# Patient Record
Sex: Female | Born: 1980 | Race: Black or African American | Hispanic: No | Marital: Single | Smoking: Former smoker
Health system: Southern US, Community
[De-identification: ages and names within clinical notes are randomized; demographics above are authoritative.]

## PROBLEM LIST (undated history)

## (undated) DIAGNOSIS — F32A Depression, unspecified: Secondary | ICD-10-CM

## (undated) DIAGNOSIS — M1990 Unspecified osteoarthritis, unspecified site: Secondary | ICD-10-CM

## (undated) DIAGNOSIS — J302 Other seasonal allergic rhinitis: Secondary | ICD-10-CM

## (undated) DIAGNOSIS — D4860 Neoplasm of uncertain behavior of unspecified breast: Secondary | ICD-10-CM

## (undated) HISTORY — DX: Other seasonal allergic rhinitis: J30.2

## (undated) HISTORY — DX: Depression, unspecified: F32.A

## (undated) HISTORY — PX: GYNECOLOGIC CRYOSURGERY: SHX857

---
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
## 2002-02-24 ENCOUNTER — Other Ambulatory Visit: Admission: RE | Admit: 2002-02-24 | Discharge: 2002-02-24 | Payer: Self-pay | Admitting: Gynecology

## 2002-09-08 ENCOUNTER — Other Ambulatory Visit: Admission: RE | Admit: 2002-09-08 | Discharge: 2002-09-08 | Payer: Self-pay | Admitting: Obstetrics and Gynecology

## 2002-10-31 ENCOUNTER — Emergency Department (HOSPITAL_COMMUNITY): Admission: EM | Admit: 2002-10-31 | Discharge: 2002-10-31 | Payer: Self-pay | Admitting: *Deleted

## 2003-04-09 ENCOUNTER — Emergency Department (HOSPITAL_COMMUNITY): Admission: EM | Admit: 2003-04-09 | Discharge: 2003-04-09 | Payer: Self-pay | Admitting: Emergency Medicine

## 2003-05-08 ENCOUNTER — Other Ambulatory Visit: Admission: RE | Admit: 2003-05-08 | Discharge: 2003-05-08 | Payer: Self-pay | Admitting: Obstetrics and Gynecology

## 2003-08-09 ENCOUNTER — Emergency Department (HOSPITAL_COMMUNITY): Admission: EM | Admit: 2003-08-09 | Discharge: 2003-08-09 | Payer: Self-pay | Admitting: Family Medicine

## 2003-11-16 ENCOUNTER — Other Ambulatory Visit: Admission: RE | Admit: 2003-11-16 | Discharge: 2003-11-16 | Payer: Self-pay | Admitting: Obstetrics and Gynecology

## 2004-10-30 ENCOUNTER — Other Ambulatory Visit: Admission: RE | Admit: 2004-10-30 | Discharge: 2004-10-30 | Payer: Self-pay | Admitting: Obstetrics and Gynecology

## 2007-05-27 ENCOUNTER — Other Ambulatory Visit: Admission: RE | Admit: 2007-05-27 | Discharge: 2007-05-27 | Payer: Self-pay | Admitting: Obstetrics and Gynecology

## 2008-04-13 ENCOUNTER — Ambulatory Visit: Payer: Self-pay | Admitting: Women's Health

## 2008-04-13 ENCOUNTER — Other Ambulatory Visit: Admission: RE | Admit: 2008-04-13 | Discharge: 2008-04-13 | Payer: Self-pay | Admitting: Obstetrics and Gynecology

## 2008-04-13 ENCOUNTER — Encounter: Payer: Self-pay | Admitting: Women's Health

## 2009-08-12 ENCOUNTER — Ambulatory Visit: Payer: Self-pay | Admitting: Women's Health

## 2009-09-12 ENCOUNTER — Other Ambulatory Visit: Admission: RE | Admit: 2009-09-12 | Discharge: 2009-09-12 | Payer: Self-pay | Admitting: Obstetrics and Gynecology

## 2009-09-12 ENCOUNTER — Ambulatory Visit: Payer: Self-pay | Admitting: Women's Health

## 2010-01-26 NOTE — L&D Delivery Note (Signed)
Delivery Note At 12:34 AM a viable and healthy female was delivered via Vaginal, Spontaneous Delivery (Presentation: Right Occiput Anterior).  APGAR: 8, 9; weight 6 lb 13 oz (3090 g).   Placenta status: Intact, Spontaneous Pathology.  2nd to mat temp Cord: 3 vessels with the following complications: Short.  Cord pH: none  Anesthesia: Epidural  Episiotomy: None Lacerations: 2nd degree;Perineal Suture Repair: 3.0 chromic Est. Blood Loss (mL): 250  Mom to postpartum.  Baby to nursery-stable.  Gabrielle Caldwell A 11/25/2010, 1:22 AM

## 2010-03-28 ENCOUNTER — Ambulatory Visit (INDEPENDENT_AMBULATORY_CARE_PROVIDER_SITE_OTHER): Payer: Managed Care, Other (non HMO) | Admitting: Women's Health

## 2010-03-28 DIAGNOSIS — Z113 Encounter for screening for infections with a predominantly sexual mode of transmission: Secondary | ICD-10-CM

## 2010-03-28 DIAGNOSIS — N912 Amenorrhea, unspecified: Secondary | ICD-10-CM

## 2010-04-14 ENCOUNTER — Other Ambulatory Visit: Payer: Managed Care, Other (non HMO)

## 2010-04-14 ENCOUNTER — Ambulatory Visit (INDEPENDENT_AMBULATORY_CARE_PROVIDER_SITE_OTHER): Payer: Managed Care, Other (non HMO) | Admitting: Women's Health

## 2010-04-14 DIAGNOSIS — O9989 Other specified diseases and conditions complicating pregnancy, childbirth and the puerperium: Secondary | ICD-10-CM

## 2010-04-14 DIAGNOSIS — N912 Amenorrhea, unspecified: Secondary | ICD-10-CM

## 2010-07-31 ENCOUNTER — Encounter: Payer: Self-pay | Admitting: *Deleted

## 2010-10-28 LAB — STREP B DNA PROBE: GBS: NEGATIVE

## 2010-11-24 ENCOUNTER — Inpatient Hospital Stay (HOSPITAL_COMMUNITY): Payer: Managed Care, Other (non HMO) | Admitting: Anesthesiology

## 2010-11-24 ENCOUNTER — Encounter (HOSPITAL_COMMUNITY): Payer: Self-pay

## 2010-11-24 ENCOUNTER — Encounter (HOSPITAL_COMMUNITY): Payer: Self-pay | Admitting: *Deleted

## 2010-11-24 ENCOUNTER — Inpatient Hospital Stay (HOSPITAL_COMMUNITY)
Admission: AD | Admit: 2010-11-24 | Discharge: 2010-11-27 | DRG: 774 | Disposition: A | Discharge status: Home / Self Care | Payer: Managed Care, Other (non HMO) | Source: Ambulatory Visit | Attending: Obstetrics and Gynecology | Admitting: Obstetrics and Gynecology

## 2010-11-24 ENCOUNTER — Inpatient Hospital Stay (HOSPITAL_COMMUNITY)
Admission: AD | Admit: 2010-11-24 | Discharge: 2010-11-24 | Disposition: A | Discharge status: Home / Self Care | Payer: Managed Care, Other (non HMO) | Source: Ambulatory Visit | Attending: Obstetrics and Gynecology | Admitting: Obstetrics and Gynecology

## 2010-11-24 ENCOUNTER — Encounter (HOSPITAL_COMMUNITY): Payer: Self-pay | Admitting: Anesthesiology

## 2010-11-24 DIAGNOSIS — D62 Acute posthemorrhagic anemia: Secondary | ICD-10-CM | POA: Diagnosis not present

## 2010-11-24 DIAGNOSIS — O479 False labor, unspecified: Secondary | ICD-10-CM | POA: Insufficient documentation

## 2010-11-24 DIAGNOSIS — K649 Unspecified hemorrhoids: Secondary | ICD-10-CM | POA: Diagnosis present

## 2010-11-24 DIAGNOSIS — O9903 Anemia complicating the puerperium: Secondary | ICD-10-CM | POA: Diagnosis not present

## 2010-11-24 DIAGNOSIS — O878 Other venous complications in the puerperium: Secondary | ICD-10-CM | POA: Diagnosis present

## 2010-11-24 LAB — RPR: RPR Ser Ql: NONREACTIVE

## 2010-11-24 LAB — CBC
Hemoglobin: 11 g/dL — ABNORMAL LOW (ref 12.0–15.0)
MCH: 28.8 pg (ref 26.0–34.0)
Platelets: 297 10*3/uL (ref 150–400)
RBC: 3.82 MIL/uL — ABNORMAL LOW (ref 3.87–5.11)
WBC: 16.3 10*3/uL — ABNORMAL HIGH (ref 4.0–10.5)

## 2010-11-24 MED ORDER — PHENYLEPHRINE 40 MCG/ML (10ML) SYRINGE FOR IV PUSH (FOR BLOOD PRESSURE SUPPORT)
80.0000 ug | PREFILLED_SYRINGE | INTRAVENOUS | Status: DC | PRN
  Filled 2010-11-24: qty 5

## 2010-11-24 MED ORDER — NALBUPHINE SYRINGE 5 MG/0.5 ML
INJECTION | INTRAMUSCULAR | Status: AC
  Administered 2010-11-24: 10 mg via INTRAVENOUS
  Filled 2010-11-24: qty 1

## 2010-11-24 MED ORDER — LACTATED RINGERS IV SOLN
500.0000 mL | Freq: Once | INTRAVENOUS | Status: AC
  Administered 2010-11-24: 500 mL via INTRAVENOUS

## 2010-11-24 MED ORDER — CITRIC ACID-SODIUM CITRATE 334-500 MG/5ML PO SOLN: 30.0000 mL | ORAL | Status: DC | PRN

## 2010-11-24 MED ORDER — IBUPROFEN 600 MG PO TABS: 600.0000 mg | ORAL_TABLET | Freq: Four times a day (QID) | ORAL | Status: DC | PRN

## 2010-11-24 MED ORDER — LIDOCAINE HCL (PF) 1 % IJ SOLN
30.0000 mL | INTRAMUSCULAR | Status: DC | PRN
  Administered 2010-11-25: 30 mL via SUBCUTANEOUS

## 2010-11-24 MED ORDER — DIPHENHYDRAMINE HCL 50 MG/ML IJ SOLN: 12.5000 mg | INTRAMUSCULAR | Status: DC | PRN

## 2010-11-24 MED ORDER — TERBUTALINE SULFATE 1 MG/ML IJ SOLN: 0.2500 mg | Freq: Once | INTRAMUSCULAR | Status: AC | PRN

## 2010-11-24 MED ORDER — FENTANYL 2.5 MCG/ML BUPIVACAINE 1/10 % EPIDURAL INFUSION (WH - ANES)
INTRAMUSCULAR | Status: DC | PRN
  Administered 2010-11-24: 14 mL/h via EPIDURAL

## 2010-11-24 MED ORDER — ACETAMINOPHEN 325 MG PO TABS: 650.0000 mg | ORAL_TABLET | ORAL | Status: DC | PRN

## 2010-11-24 MED ORDER — OXYTOCIN BOLUS FROM INFUSION
500.0000 mL | Freq: Once | INTRAVENOUS | Status: DC
  Filled 2010-11-24: qty 500

## 2010-11-24 MED ORDER — FENTANYL 2.5 MCG/ML BUPIVACAINE 1/10 % EPIDURAL INFUSION (WH - ANES)
14.0000 mL/h | INTRAMUSCULAR | Status: DC
  Administered 2010-11-24 (×2): 14 mL/h via EPIDURAL
  Filled 2010-11-24 (×3): qty 60

## 2010-11-24 MED ORDER — EPHEDRINE 5 MG/ML INJ: 10.0000 mg | INTRAVENOUS | Status: DC | PRN

## 2010-11-24 MED ORDER — NALBUPHINE HCL 10 MG/ML IJ SOLN
10.0000 mg | Freq: Once | INTRAMUSCULAR | Status: AC
  Administered 2010-11-24: 10 mg via INTRAVENOUS

## 2010-11-24 MED ORDER — LACTATED RINGERS IV SOLN: 500.0000 mL | INTRAVENOUS | Status: DC | PRN

## 2010-11-24 MED ORDER — EPHEDRINE 5 MG/ML INJ
10.0000 mg | INTRAVENOUS | Status: DC | PRN
  Filled 2010-11-24: qty 4

## 2010-11-24 MED ORDER — LIDOCAINE HCL 1.5 % IJ SOLN
INTRAMUSCULAR | Status: DC | PRN
  Administered 2010-11-24 (×2): 5 mL via EPIDURAL

## 2010-11-24 MED ORDER — FLEET ENEMA 7-19 GM/118ML RE ENEM: 1.0000 | ENEMA | RECTAL | Status: DC | PRN

## 2010-11-24 MED ORDER — OXYTOCIN 20 UNITS IN LACTATED RINGERS INFUSION - SIMPLE
1.0000 m[IU]/min | INTRAVENOUS | Status: DC
  Administered 2010-11-24: 2 m[IU]/min via INTRAVENOUS
  Administered 2010-11-25: 333 m[IU]/min via INTRAVENOUS
  Filled 2010-11-24: qty 1000

## 2010-11-24 MED ORDER — LACTATED RINGERS IV SOLN
INTRAVENOUS | Status: DC
  Administered 2010-11-24: 18:00:00 via INTRAVENOUS

## 2010-11-24 MED ORDER — OXYTOCIN 20 UNITS IN LACTATED RINGERS INFUSION - SIMPLE: 125.0000 mL/h | Freq: Once | INTRAVENOUS | Status: DC

## 2010-11-24 MED ORDER — PHENYLEPHRINE 40 MCG/ML (10ML) SYRINGE FOR IV PUSH (FOR BLOOD PRESSURE SUPPORT): 80.0000 ug | PREFILLED_SYRINGE | INTRAVENOUS | Status: DC | PRN

## 2010-11-24 MED ORDER — OXYCODONE-ACETAMINOPHEN 5-325 MG PO TABS: 2.0000 | ORAL_TABLET | ORAL | Status: DC | PRN

## 2010-11-24 MED ORDER — LIDOCAINE HCL (PF) 1 % IJ SOLN
INTRAMUSCULAR | Status: AC
  Administered 2010-11-25: 30 mL via SUBCUTANEOUS
  Filled 2010-11-24: qty 30

## 2010-11-24 MED ORDER — ONDANSETRON HCL 4 MG/2ML IJ SOLN: 4.0000 mg | Freq: Four times a day (QID) | INTRAMUSCULAR | Status: DC | PRN

## 2010-11-24 NOTE — H&P (Signed)
  OB ADMISSION/ HISTORY & PHYSICAL:  Admission Date: 11/24/2010  3:45 PM  Admit Diagnosis: 39 weeks / active labor  Gabrielle Caldwell is a 30 y.o. female presenting for onset of labor.  Prenatal History: G2P0010   EDC : 11/30/2010 Prenatal care at [facility] since 11 weeks with Dr Cherly Hensen as primary.  Prenatal course has been uncomplicated.  Prenatal Labs: ABO, Rh: A (04/18 0000) Positive Antibody: Negative (04/18 0000) Rubella: Immune (04/18 0000)  RPR:   NR HBsAg: Negative (04/18 0000)  HIV: Non-reactive (04/18 0000)  GBS:   Negative 1 hr Glucola : 116 ( NL)   Medical / Surgical History :  Past medical history:  Past Medical History  Diagnosis Date  . 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 Dysmenorrhea   . Gonorrhea 08/2009  . Chlamydia 08/2009 & 03/2010  . 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 Gynecologic cryosurgery     FOR DYSPLASIA     Family History:  Family History  Problem Relation Age of Onset  . Adopted: Yes     Social History:  reports that she has never smoked. She has never used smokeless tobacco. She reports that she does not drink alcohol or use illicit drugs.   Allergies: Bee and Strawberry    Current Medications at time of admission:  Prenatal vitamin daily  Review of Systems: Onset of ctx with SROM at 1445  Physical Exam: Cervical exam by nurse on arrival to BS: Dilation: 6.5 Effacement (%): 100 Station: 0 Exam by:: apannell,rn  General: uncomfortable - moaning and crying with ctx Heart:RR Lungs: labored breathing with ctx Abdomen: gravid / NT Extremities:no edema  FHR: 145 / moderate variability / no decels TOCO: 2-6 minutes / moderate- strong   Assessment: 39 weeks active labor  Plan:  Admit Nubain 10 mg IV now - awaiting epidural placement Dr Cherly Hensen to follow when available from OR  BAILEY,TANYA 11/24/2010,  5:58 PM

## 2010-11-24 NOTE — ED Notes (Signed)
Pt. Sent walking x1 hour

## 2010-11-24 NOTE — Progress Notes (Signed)
Pt states went home, u/c's now in her back, feeling rectal pressure, srom at 1445, pink/clear tinged fluid. Unable to sleep. Decreased fm after leaving MAU this am.

## 2010-11-24 NOTE — Anesthesia Preprocedure Evaluation (Signed)
Anesthesia Evaluation  Patient identified by MRN, date of birth, ID band Patient awake  General Assessment Comment  Reviewed: Allergy & Precautions, H&P , NPO status , Patient's Chart, lab work & pertinent test results  Airway Mallampati: III TM Distance: >3 FB Neck ROM: full    Dental No notable dental hx.    Pulmonary    Pulmonary exam normal       Cardiovascular     Neuro/Psych Negative Neurological ROS  Negative Psych ROS   GI/Hepatic negative GI ROS Neg liver ROS    Endo/Other  Morbid obesity  Renal/GU negative Renal ROS  Genitourinary negative   Musculoskeletal negative musculoskeletal ROS (+)   Abdominal (+) obese,   Peds negative pediatric ROS (+)  Hematology negative hematology ROS (+)   Anesthesia Other Findings   Reproductive/Obstetrics (+) Pregnancy                           Anesthesia Physical Anesthesia Plan  ASA: III  Anesthesia Plan: Epidural   Post-op Pain Management:    Induction:   Airway Management Planned:   Additional Equipment:   Intra-op Plan:   Post-operative Plan:   Informed Consent: I have reviewed the patients History and Physical, chart, labs and discussed the procedure including the risks, benefits and alternatives for the proposed anesthesia with the patient or authorized representative who has indicated his/her understanding and acceptance.     Plan Discussed with:   Anesthesia Plan Comments:         Anesthesia Quick Evaluation

## 2010-11-24 NOTE — Progress Notes (Signed)
  TC from nurse - requesting IV order for analgesia pending epidural. Anesthesia delayed with emergency case / Dr Cherly Hensen finishing case now.  Order for Nubain 10 mg Iv now.

## 2010-11-24 NOTE — Progress Notes (Signed)
Pt states u/c's q2-3 minutes, worse post walking, was ft/90%. More mucus plug coming out still. +FM post walking.

## 2010-11-24 NOTE — Anesthesia Procedure Notes (Signed)
Epidural Patient location during procedure: OB Start time: 11/24/2010 6:21 PM End time: 11/24/2010 6:28 PM Reason for block: procedure for pain  Staffing Anesthesiologist: Sandrea Hughs Performed by: anesthesiologist   Preanesthetic Checklist Completed: patient identified, site marked, surgical consent, pre-op evaluation, timeout performed, IV checked, risks and benefits discussed and monitors and equipment checked  Epidural Patient position: sitting Prep: site prepped and draped and DuraPrep Patient monitoring: continuous pulse ox and blood pressure Approach: midline Injection technique: LOR air  Needle:  Needle type: Tuohy  Needle gauge: 17 G Needle length: 9 cm Needle insertion depth: 8 cm Catheter type: closed end flexible Catheter size: 19 Gauge Catheter at skin depth: 14 cm Test dose: negative and 1.5% lidocaine  Assessment Sensory level: T10 Events: blood not aspirated, injection not painful, no injection resistance, negative IV test and no paresthesia

## 2010-11-24 NOTE — ED Notes (Signed)
Dr. Cherly Hensen notified pt post walking, no cervical change, okay to d/c pt to home.

## 2010-11-25 ENCOUNTER — Other Ambulatory Visit: Payer: Self-pay | Admitting: Obstetrics and Gynecology

## 2010-11-25 ENCOUNTER — Encounter (HOSPITAL_COMMUNITY): Payer: Self-pay

## 2010-11-25 MED ORDER — ONDANSETRON HCL 4 MG PO TABS: 4.0000 mg | ORAL_TABLET | ORAL | Status: DC | PRN

## 2010-11-25 MED ORDER — DIPHENHYDRAMINE HCL 25 MG PO CAPS: 25.0000 mg | ORAL_CAPSULE | Freq: Four times a day (QID) | ORAL | Status: DC | PRN

## 2010-11-25 MED ORDER — SENNOSIDES-DOCUSATE SODIUM 8.6-50 MG PO TABS
2.0000 | ORAL_TABLET | Freq: Every day | ORAL | Status: DC
  Administered 2010-11-25 – 2010-11-26 (×2): 2 via ORAL

## 2010-11-25 MED ORDER — IBUPROFEN 600 MG PO TABS
600.0000 mg | ORAL_TABLET | Freq: Four times a day (QID) | ORAL | Status: DC
  Administered 2010-11-25 – 2010-11-27 (×9): 600 mg via ORAL
  Filled 2010-11-25 (×9): qty 1

## 2010-11-25 MED ORDER — TETANUS-DIPHTH-ACELL PERTUSSIS 5-2.5-18.5 LF-MCG/0.5 IM SUSP
0.5000 mL | Freq: Once | INTRAMUSCULAR | Status: AC
  Administered 2010-11-26: 0.5 mL via INTRAMUSCULAR
  Filled 2010-11-25: qty 0.5

## 2010-11-25 MED ORDER — FERROUS SULFATE 325 (65 FE) MG PO TABS
325.0000 mg | ORAL_TABLET | Freq: Two times a day (BID) | ORAL | Status: DC
  Administered 2010-11-25 – 2010-11-27 (×4): 325 mg via ORAL
  Filled 2010-11-25 (×4): qty 1

## 2010-11-25 MED ORDER — WITCH HAZEL-GLYCERIN EX PADS: 1.0000 "application " | MEDICATED_PAD | CUTANEOUS | Status: DC | PRN

## 2010-11-25 MED ORDER — PRENATAL PLUS 27-1 MG PO TABS
1.0000 | ORAL_TABLET | Freq: Every day | ORAL | Status: DC
  Administered 2010-11-25 – 2010-11-27 (×3): 1 via ORAL
  Filled 2010-11-25 (×3): qty 1

## 2010-11-25 MED ORDER — OXYCODONE-ACETAMINOPHEN 5-325 MG PO TABS
1.0000 | ORAL_TABLET | ORAL | Status: DC | PRN
  Administered 2010-11-25 (×2): 1 via ORAL
  Administered 2010-11-26: 2 via ORAL
  Filled 2010-11-25 (×2): qty 1
  Filled 2010-11-25: qty 2

## 2010-11-25 MED ORDER — ZOLPIDEM TARTRATE 5 MG PO TABS: 5.0000 mg | ORAL_TABLET | Freq: Every evening | ORAL | Status: DC | PRN

## 2010-11-25 MED ORDER — BENZOCAINE-MENTHOL 20-0.5 % EX AERO: 1.0000 "application " | INHALATION_SPRAY | CUTANEOUS | Status: DC | PRN

## 2010-11-25 MED ORDER — DIBUCAINE 1 % RE OINT
1.0000 "application " | TOPICAL_OINTMENT | RECTAL | Status: DC | PRN
  Administered 2010-11-26: 1 via RECTAL
  Filled 2010-11-25: qty 28

## 2010-11-25 MED ORDER — SIMETHICONE 80 MG PO CHEW: 80.0000 mg | CHEWABLE_TABLET | ORAL | Status: DC | PRN

## 2010-11-25 MED ORDER — ONDANSETRON HCL 4 MG/2ML IJ SOLN: 4.0000 mg | INTRAMUSCULAR | Status: DC | PRN

## 2010-11-25 MED ORDER — LANOLIN HYDROUS EX OINT: TOPICAL_OINTMENT | CUTANEOUS | Status: DC | PRN

## 2010-11-25 NOTE — Addendum Note (Signed)
Addendum  created 11/25/10 0932 by Chrisha Vogel   Modules edited:Charges VN, Notes Section    

## 2010-11-25 NOTE — Progress Notes (Signed)
S: comfortable. Notes some rectal pressure  O: Ve : fully dilated (+) 2 station Pitocin 6 MIU  Tracing: baseline 140 (+) some early decel some variable cgtx q 2 mins  IMP: Complete P) start pushing

## 2010-11-25 NOTE — Anesthesia Postprocedure Evaluation (Signed)
Anesthesia Post Note  Patient: Gabrielle Caldwell  Procedure(s) Performed: * No procedures listed *  Anesthesia type: Epidural  Patient location: Mother/Baby  Post pain: Pain level controlled  Post assessment: Post-op Vital signs reviewed  Last Vitals:  Filed Vitals:   11/25/10 0800  BP: 130/82  Pulse: 69  Temp: 36.9 C  Resp: 18    Post vital signs: Reviewed  Level of consciousness: awake  Complications: No apparent anesthesia complications

## 2010-11-25 NOTE — Addendum Note (Signed)
Addendum  created 11/25/10 0932 by Cephus Shelling   Modules edited:Charges VN, Notes Section

## 2010-11-25 NOTE — Progress Notes (Signed)
  PPD 0 SVD  S:  Reports feeling exhausted - no sleep in past few days             Tolerating po/ No nausea or vomiting             Bleeding is moderate             Pain controlled withprescription NSAID's including motrin and narcotic analgesics including percocet             Up ad lib / ambulatory  Newborn breast feeding  / Circumcision requested for tomorrow   O:  A & O x 3              VS: Blood pressure 130/82, pulse 69, temperature 98.5 F (36.9 C), temperature source Oral, resp. rate 18, height 5' 2.5" (1.588 m), weight 103.874 kg (229 lb), SpO2 100.00%, unknown if currently breastfeeding.  LABS: Lab Results  Component Value Date   WBC 16.3* 11/24/2010   HGB 11.0* 11/24/2010   HCT 33.2* 11/24/2010   MCV 86.9 11/24/2010   PLT 297 11/24/2010     Lungs: Clear and unlabored  Heart: regular rate and rhythm / no mumurs  Abdomen: soft, non-tender, non-distended              Fundus: firm, non-tender, Ueven  Perineum: mild edema / ice pack in place  Lochia: moderate  Extremities: trace edema, no calf pain or tenderness    A: PPD # 0   Doing well - stable status  P:  Routine post partum orders  Newborn circ tomorrow             Encouraged rest today  Aravind Chrismer 11/25/2010, 9:31 AM

## 2010-11-25 NOTE — Anesthesia Postprocedure Evaluation (Signed)
  Anesthesia Post Note  Patient: Gabrielle Caldwell  Procedure(s) Performed: * No procedures listed *  Anesthesia type: Epidural  Patient location: Mother/Baby  Post pain: Pain level controlled  Post assessment: Post-op Vital signs reviewed  Last Vitals:  Filed Vitals:   11/25/10 0800  BP: 130/82  Pulse: 69  Temp: 36.9 C  Resp: 18    Post vital signs: Reviewed  Level of consciousness:alert  Complications: No apparent anesthesia complications

## 2010-11-25 NOTE — Progress Notes (Signed)
S: Epidural Comfortable   VE: 5/90/-2 ? Light mec fluid  IUPC placed Tracing: reassuring, ctx q 2-4 mins  Imp: Active phase Term gestation  P) Pitocin augmentation

## 2010-11-26 LAB — CBC
MCHC: 32.4 g/dL (ref 30.0–36.0)
Platelets: 235 10*3/uL (ref 150–400)
RDW: 14.5 % (ref 11.5–15.5)

## 2010-11-26 LAB — URINE CULTURE
Colony Count: NO GROWTH
Culture  Setup Time: 201210300452
Culture: NO GROWTH
Special Requests: NORMAL

## 2010-11-26 NOTE — Progress Notes (Signed)
  PPD # 1  Subjective: Pt reports feeling well/ Pain controlled with prescription NSAID's including motrin and narcotic analgesics including percocet Tolerating po/ Voiding without problems/ No n/v Bleeding is light Newborn info:  Information for the patient's newborn:  Annet, Manukyan [[id number]]  female  / circ today/ Feeding: breast    Objective:  VS: Blood pressure 117/77, pulse 75, temperature 98.7 F (37.1 C), temperature source Oral, resp. rate 18  Basename 11/26/10 0555 11/24/10 1725  WBC 11.8* 16.3*  HGB 8.4* 11.0*  HCT 25.9* 33.2*  PLT 235 297    Blood type: A/Positive/-- (04/18 0000) Rubella: Immune (04/18 0000)    Physical Exam:  General: alert, cooperative and no distress Abdomen: soft, nontender, normal bowel sounds Uterine Fundus: firm, below umbilicus, nontender Perineum: healing with good reapproximation Rectum: cluster of 2 to 3 sm hemorrhoids; non thrombosed, pink Lochia: minimal Ext: Homans sign is negative, no sign of DVT   A/P: PPD # 1/ G2P1011 ABL Anemia Hemorrhoids; mod; needs dermaplast and dibucaine a/o Doing well Circ today Continue routine post partum orders Anticipate discharge home in AM

## 2010-11-27 MED ORDER — NIFEREX-150 FORTE 50-100 MG PO CAPS: 1.0000 | ORAL_CAPSULE | Freq: Every day | ORAL | Status: DC

## 2010-11-27 MED ORDER — OXYCODONE-ACETAMINOPHEN 5-325 MG PO TABS: 1.0000 | ORAL_TABLET | ORAL | Status: AC | PRN

## 2010-11-27 MED ORDER — IBUPROFEN 600 MG PO TABS: 600.0000 mg | ORAL_TABLET | Freq: Four times a day (QID) | ORAL | Status: AC

## 2010-11-27 NOTE — Progress Notes (Signed)
  PPD 2 SVD  S:  Reports feeling ok - tired and bottom still really sore             Tolerating po/ No nausea or vomiting             Bleeding is light             Pain controlled withprescription NSAID's including motrin and narcotic analgesics including percocet             Up ad lib / ambulatory  Newborn breast and bottle feeding  / Circumcision done   O:  A & O x 3 NAD             VS: Blood pressure 123/84, pulse 98, temperature 98 F (36.7 C), temperature source Oral, resp. rate 18, height 5' 2.5" (1.588 m), weight 103.874 kg (229 lb), SpO2 98.00%, unknown if currently breastfeeding.  LABS: Lab Results  Component Value Date   WBC 11.8* 11/26/2010   HGB 8.4* 11/26/2010   HCT 25.9* 11/26/2010   MCV 88.4 11/26/2010   PLT 235 11/26/2010     Lungs: Clear and unlabored  Heart: regular rate and rhythm / no mumurs  Abdomen: soft, non-tender, non-distended              Fundus: firm, non-tender, U-1  Perineum: mild edema  Lochia: light  Extremities: trace edema, no calf pain or tenderness    A: PPD # 2              Anemia  Doing well - stable status  P:  Routine post partum orders  Discharge home             Iron and colace x 6 weeks - recheck iron and CBC at 6 week visit             WOB booklet - reviewed all discharge instructions / signs to call / 6 week exam  Christoper Bushey 11/27/2010, 9:32 AM

## 2010-11-27 NOTE — Discharge Summary (Signed)
Obstetric Discharge Summary Reason for Admission: onset of labor Prenatal Procedures: none Intrapartum Procedures: spontaneous vaginal delivery Postpartum Procedures: none Complications-Operative and Postpartum: 2 degree perineal laceration and ABL anemia Hemoglobin  Date Value Range Status  11/26/2010 8.4* 12.0-15.0 (g/dL) Final     DELTA CHECK NOTED     REPEATED TO VERIFY     HCT  Date Value Range Status  11/26/2010 25.9* 36.0-46.0 (%) Final    Discharge Diagnoses: Term Pregnancy-delivered  Discharge Information: Date: 11/27/2010 Activity: pelvic rest Diet: routine Medications: PNV, Ibuprofen, Iron and Percocet Condition: stable Instructions: refer to practice specific booklet Discharge to: home Follow-up Information    Follow up with [name],[name], MD. Make an appointment in 6 weeks.   Contact information:   [address] [place] [address] [phone]          Newborn Data: Live born female  Birth Weight: 6 lb 13 oz (3090 g) APGAR: 8, 9  Home with mother.  Gabrielle Caldwell 11/27/2010, 9:38 AM

## 2012-10-26 HISTORY — PX: RETINAL DETACHMENT SURGERY: SHX105

## 2012-11-10 ENCOUNTER — Encounter: Payer: Self-pay | Admitting: Women's Health

## 2012-11-23 ENCOUNTER — Ambulatory Visit (INDEPENDENT_AMBULATORY_CARE_PROVIDER_SITE_OTHER): Payer: Managed Care, Other (non HMO) | Admitting: Women's Health

## 2012-11-23 ENCOUNTER — Encounter: Payer: Self-pay | Admitting: Women's Health

## 2012-11-23 VITALS — BP 118/78 | Ht 62.0 in | Wt 228.0 lb

## 2012-11-23 DIAGNOSIS — Z01419 Encounter for gynecological examination (general) (routine) without abnormal findings: Secondary | ICD-10-CM

## 2012-11-23 DIAGNOSIS — N760 Acute vaginitis: Secondary | ICD-10-CM

## 2012-11-23 DIAGNOSIS — A499 Bacterial infection, unspecified: Secondary | ICD-10-CM

## 2012-11-23 DIAGNOSIS — B9689 Other specified bacterial agents as the cause of diseases classified elsewhere: Secondary | ICD-10-CM

## 2012-11-23 DIAGNOSIS — IMO0001 Reserved for inherently not codable concepts without codable children: Secondary | ICD-10-CM

## 2012-11-23 DIAGNOSIS — Z833 Family history of diabetes mellitus: Secondary | ICD-10-CM

## 2012-11-23 DIAGNOSIS — N898 Other specified noninflammatory disorders of vagina: Secondary | ICD-10-CM

## 2012-11-23 DIAGNOSIS — Z309 Encounter for contraceptive management, unspecified: Secondary | ICD-10-CM

## 2012-11-23 LAB — CBC WITH DIFFERENTIAL/PLATELET
Eosinophils Absolute: 0 10*3/uL (ref 0.0–0.7)
Eosinophils Relative: 1 % (ref 0–5)
HCT: 36.4 % (ref 36.0–46.0)
Hemoglobin: 12.4 g/dL (ref 12.0–15.0)
Lymphs Abs: 1.8 10*3/uL (ref 0.7–4.0)
MCH: 29.2 pg (ref 26.0–34.0)
MCV: 85.8 fL (ref 78.0–100.0)
Monocytes Relative: 7 % (ref 3–12)
RBC: 4.24 MIL/uL (ref 3.87–5.11)

## 2012-11-23 LAB — WET PREP FOR TRICH, YEAST, CLUE

## 2012-11-23 MED ORDER — ETONOGESTREL-ETHINYL ESTRADIOL 0.12-0.015 MG/24HR VA RING: VAGINAL_RING | VAGINAL | Status: DC

## 2012-11-23 MED ORDER — VALACYCLOVIR HCL 500 MG PO TABS: ORAL_TABLET | ORAL | Status: DC

## 2012-11-23 MED ORDER — METRONIDAZOLE 0.75 % VA GEL: VAGINAL | Status: DC

## 2012-11-23 NOTE — Patient Instructions (Signed)

## 2012-11-23 NOTE — Progress Notes (Signed)
Gabrielle Caldwell 02/26/1980 [id number]    History:    The patient presents for annual exam.  Delivered 10/2010 Dr. Cherly Hensen. 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 HSV-2/ rare outbreaks. Nexplanon placed 2013 removed last month, had been bleeding 2 to 3 weeks of every month. Detached retina last week, surgically repaired.  Past medical history, past surgical history, family history and social history were all reviewed and documented in the EPIC chart. 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 Gabrielle Caldwell 4 doing well. Adopted. History of positive GC and Chlamydia, negative cultures with partner.   ROS:  A  ROS was performed and pertinent positives and negatives are included in the history.  Exam:  Filed Vitals:   11/23/12 1014  BP: 118/78    General appearance:  Normal Head/Neck:  Normal, without cervical or supraclavicular adenopathy. Thyroid:  Symmetrical, normal in size, without palpable masses or nodularity. Respiratory  Effort:  Normal  Auscultation:  Clear without wheezing or rhonchi Cardiovascular  Auscultation:  Regular rate, without rubs, murmurs or gallops  Edema/varicosities:  Not grossly evident Abdominal  Soft,nontender, without masses, guarding or rebound.  Liver/spleen:  No organomegaly noted  Hernia:  None appreciated  Skin  Inspection:  Grossly normal  Palpation:  Grossly normal Neurologic/psychiatric  Orientation:  Normal with appropriate conversation.  Mood/affect:  Normal  Genitourinary    Breasts: Examined lying and sitting.     Right: Without masses, retractions, discharge or axillary adenopathy.     Left: Without masses, retractions, discharge or axillary adenopathy.   Inguinal/mons:  Normal without inguinal adenopathy  External genitalia:  Normal  BUS/Urethra/Skene's glands:  Normal  Bladder:  Normal  Vagina:  Wet prep positive for amines, clues, TNTC bacteria  Cervix:  Normal  Uterus:   normal in size, shape and contour.  Midline and  mobile  Adnexa/parametria:     Rt: Without masses or tenderness.   Lt: Without masses or tenderness.  Anus and perineum: Normal  Digital rectal exam: Normal sphincter tone without palpated masses or tenderness  Assessment/Plan:  32 y.o. SBF G4P1 for annual exam.  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 Dr. Lily Peer to place with next cycle. Will check coverage. Nuva ring prescription, proper use, reviewed risk for  blood clots, strokes, condoms first month, start with next cycle after eye surgery healed if decides against Mirena. SBE's, regular exercise, decrease calories for weight loss, MVI daily encouraged. MetroGel vaginal cream 1 applicator at bedtime x5 prescription, proper use, alcohol precautions reviewed. Pap normal January 2013, new screening guidelines reviewed. Valtrex 500 twice daily for 3-5 days as needed, prescription, proper use given and reviewed   Harrington Challenger [facility], 11:31 AM 11/23/2012

## 2012-11-24 LAB — URINALYSIS W MICROSCOPIC + REFLEX CULTURE
Bacteria, UA: NONE SEEN
Bilirubin Urine: NEGATIVE
Casts: NONE SEEN
Crystals: NONE SEEN
Ketones, ur: NEGATIVE mg/dL
Nitrite: NEGATIVE
Specific Gravity, Urine: 1.03 (ref 1.005–1.030)
pH: 5.5 (ref 5.0–8.0)

## 2013-07-07 ENCOUNTER — Encounter (INDEPENDENT_AMBULATORY_CARE_PROVIDER_SITE_OTHER): Payer: Managed Care, Other (non HMO) | Admitting: Ophthalmology

## 2013-07-07 DIAGNOSIS — H43819 Vitreous degeneration, unspecified eye: Secondary | ICD-10-CM

## 2013-07-07 DIAGNOSIS — H33309 Unspecified retinal break, unspecified eye: Secondary | ICD-10-CM

## 2013-07-07 DIAGNOSIS — H33009 Unspecified retinal detachment with retinal break, unspecified eye: Secondary | ICD-10-CM

## 2013-09-22 ENCOUNTER — Telehealth: Payer: Self-pay | Admitting: *Deleted

## 2013-09-22 NOTE — Telephone Encounter (Signed)
Pt calling requesting to have BCP rather than nuvaring due to vaginal irratation with nuvaring. I explained to pt OV best to discuss with nancy. Pt said she noticed slight odor and discharge with placement of nuvaring. I also explained to pt it could be infection and OV best regardless. Pt transferred to front desk.

## 2013-11-24 ENCOUNTER — Other Ambulatory Visit (HOSPITAL_COMMUNITY)
Admission: RE | Admit: 2013-11-24 | Discharge: 2013-11-24 | Disposition: A | Discharge status: Home / Self Care | Payer: Managed Care, Other (non HMO) | Source: Ambulatory Visit | Attending: Gynecology | Admitting: Gynecology

## 2013-11-24 ENCOUNTER — Encounter: Payer: Self-pay | Admitting: Women's Health

## 2013-11-24 ENCOUNTER — Ambulatory Visit (INDEPENDENT_AMBULATORY_CARE_PROVIDER_SITE_OTHER): Payer: Managed Care, Other (non HMO) | Admitting: Women's Health

## 2013-11-24 VITALS — BP 124/80 | Ht 64.0 in | Wt 221.0 lb

## 2013-11-24 DIAGNOSIS — Z13220 Encounter for screening for lipoid disorders: Secondary | ICD-10-CM

## 2013-11-24 DIAGNOSIS — Z01419 Encounter for gynecological examination (general) (routine) without abnormal findings: Secondary | ICD-10-CM | POA: Diagnosis present

## 2013-11-24 DIAGNOSIS — B009 Herpesviral infection, unspecified: Secondary | ICD-10-CM

## 2013-11-24 DIAGNOSIS — Z833 Family history of diabetes mellitus: Secondary | ICD-10-CM

## 2013-11-24 DIAGNOSIS — Z113 Encounter for screening for infections with a predominantly sexual mode of transmission: Secondary | ICD-10-CM

## 2013-11-24 MED ORDER — VALACYCLOVIR HCL 500 MG PO TABS: ORAL_TABLET | ORAL | Status: DC

## 2013-11-24 NOTE — Progress Notes (Signed)
Gabrielle Caldwell 1980-03-21 [id number]    History:    Presents for annual exam.  Regular monthly cycle/condoms/new partner. Nexplanon  removed 09/2012. 2001, 2002 CIN-1 with cryo-with normal Paps after. HSV with rare outbreaks. Detached retina 10/2012. Requesting permanent sterilization, does not want anymore children.  Past medical history, past surgical history, family history and social history were all reviewed and documented in the EPIC chart. Works [employer]. [name] 5 doing well. Planning marriage summer 2016. 2011 gonorrhea and chlamydia.  ROS:  A  12 point ROS was performed and pertinent positives and negatives are included.  Exam:  Filed Vitals:   11/24/13 0921  BP: 124/80    General appearance:  Normal Thyroid:  Symmetrical, normal in size, without palpable masses or nodularity. Respiratory  Auscultation:  Clear without wheezing or rhonchi Cardiovascular  Auscultation:  Regular rate, without rubs, murmurs or gallops  Edema/varicosities:  Not grossly evident Abdominal  Soft,nontender, without masses, guarding or rebound.  Liver/spleen:  No organomegaly noted  Hernia:  None appreciated  Skin  Inspection:  Grossly normal   Breasts: Examined lying and sitting.     Right: Without masses, retractions, discharge or axillary adenopathy.     Left: Without masses, retractions, discharge or axillary adenopathy. Gentitourinary   Inguinal/mons:  Normal without inguinal adenopathy  External genitalia:  Normal  BUS/Urethra/Skene's glands:  Normal  Vagina:  Normal  Cervix:  Normal  Uterus:  normal in size, shape and contour.  Midline and mobile  Adnexa/parametria:     Rt: Without masses or tenderness.   Lt: Without masses or tenderness.  Anus and perineum: Normal  Digital rectal exam: Normal sphincter tone without palpated masses or tenderness  Assessment/Plan:  32 y.o.SBF G1P1  for annual exam.     Regular monthly cycle/condoms Contraception management HSV 2002 CIN-1  cryo-with normal Paps after STD screen  Plan: Contraception options reviewed, Mirena IUD information given and reviewed slight risk for  infection, perforation or hemorrhage. Instructed to return to office with cycle for Dr.  Toney Rakes to place. Condoms until. Valtrex 500 twice daily for 3-5 days as needed. SBE's, regular exercise, calcium rich diet, MVI daily encouraged. CBC, glucose, UA, GC/Chlamydia, HIV, hep B, C, RPR Pap with HR HPV typing, New Pap screening guidelines reviewed.    Huel Cote WHNP, 11:03 AM 11/24/2013

## 2013-11-24 NOTE — Patient Instructions (Signed)

## 2013-11-25 LAB — URINALYSIS W MICROSCOPIC + REFLEX CULTURE
BILIRUBIN URINE: NEGATIVE
Bacteria, UA: NONE SEEN
Casts: NONE SEEN
Crystals: NONE SEEN
GLUCOSE, UA: NEGATIVE mg/dL
HGB URINE DIPSTICK: NEGATIVE
Ketones, ur: NEGATIVE mg/dL
LEUKOCYTES UA: NEGATIVE
Nitrite: NEGATIVE
PROTEIN: NEGATIVE mg/dL
Specific Gravity, Urine: 1.018 (ref 1.005–1.030)
Squamous Epithelial / LPF: NONE SEEN
UROBILINOGEN UA: 0.2 mg/dL (ref 0.0–1.0)
pH: 6.5 (ref 5.0–8.0)

## 2013-11-25 LAB — GC/CHLAMYDIA PROBE AMP
CT Probe RNA: NEGATIVE
GC Probe RNA: POSITIVE — AB

## 2013-11-25 LAB — LIPID PANEL
CHOLESTEROL: 176 mg/dL (ref 0–200)
HDL: 47 mg/dL (ref >39)
LDL Cholesterol: 121 mg/dL — ABNORMAL HIGH (ref 0–99)
TRIGLYCERIDES: 40 mg/dL (ref <150)
Total CHOL/HDL Ratio: 3.7 Ratio
VLDL: 8 mg/dL (ref 0–40)

## 2013-11-25 LAB — CBC WITH DIFFERENTIAL/PLATELET
Basophils Absolute: 0 10*3/uL (ref 0.0–0.1)
Basophils Relative: 1 % (ref 0–1)
Eosinophils Absolute: 0 10*3/uL (ref 0.0–0.7)
Eosinophils Relative: 1 % (ref 0–5)
HCT: 35.2 % — ABNORMAL LOW (ref 36.0–46.0)
HEMOGLOBIN: 12.1 g/dL (ref 12.0–15.0)
LYMPHS ABS: 1.5 10*3/uL (ref 0.7–4.0)
LYMPHS PCT: 36 % (ref 12–46)
MCH: 30.3 pg (ref 26.0–34.0)
MCHC: 34.4 g/dL (ref 30.0–36.0)
MCV: 88.2 fL (ref 78.0–100.0)
Monocytes Absolute: 0.4 10*3/uL (ref 0.1–1.0)
Monocytes Relative: 9 % (ref 3–12)
NEUTROS PCT: 53 % (ref 43–77)
Neutro Abs: 2.3 10*3/uL (ref 1.7–7.7)
PLATELETS: 331 10*3/uL (ref 150–400)
RBC: 3.99 MIL/uL (ref 3.87–5.11)
RDW: 14.4 % (ref 11.5–15.5)
WBC: 4.3 10*3/uL (ref 4.0–10.5)

## 2013-11-25 LAB — GLUCOSE, RANDOM: GLUCOSE: 81 mg/dL (ref 70–99)

## 2013-11-25 LAB — HEPATITIS B SURFACE ANTIGEN: Hepatitis B Surface Ag: NEGATIVE

## 2013-11-25 LAB — HEPATITIS C ANTIBODY: HCV AB: NEGATIVE

## 2013-11-25 LAB — HIV ANTIBODY (ROUTINE TESTING W REFLEX): HIV: NONREACTIVE

## 2013-11-25 LAB — RPR

## 2013-11-27 ENCOUNTER — Other Ambulatory Visit: Payer: Self-pay | Admitting: Women's Health

## 2013-11-27 ENCOUNTER — Encounter: Payer: Self-pay | Admitting: Women's Health

## 2013-11-27 MED ORDER — AZITHROMYCIN 500 MG PO TABS: 1000.0000 mg | ORAL_TABLET | Freq: Every day | ORAL | Status: DC

## 2013-11-27 MED ORDER — CEFIXIME 400 MG PO CAPS
400.0000 mg | ORAL_CAPSULE | Freq: Every day | ORAL | Status: DC
Start: 2013-11-27 — End: 2014-12-05

## 2013-11-28 LAB — CYTOLOGY - PAP

## 2013-12-19 ENCOUNTER — Ambulatory Visit (INDEPENDENT_AMBULATORY_CARE_PROVIDER_SITE_OTHER): Payer: Managed Care, Other (non HMO) | Admitting: Women's Health

## 2013-12-19 ENCOUNTER — Encounter: Payer: Self-pay | Admitting: Women's Health

## 2013-12-19 VITALS — BP 115/80 | Ht 62.0 in | Wt 220.0 lb

## 2013-12-19 DIAGNOSIS — Z113 Encounter for screening for infections with a predominantly sexual mode of transmission: Secondary | ICD-10-CM

## 2013-12-19 NOTE — Progress Notes (Signed)
Patient ID: Gabrielle Caldwell, female   DOB: 1980/06/03, 33 y.o.   MRN: [id number] Presents for test of cure GC. Treated with Suprax, Zithromax 11/27/2013, has abstained. Partner treated. Had negative HIV, hepatitis and RPR at annual visit. Contraceptives with NuvaRing. Denies vaginal discharge, abdominal pain, urinary symptoms.  Exam: Appears well. External genitalia within normal limits, speculum exam no visible discharge, GC/Chlamydia culture taken. Bimanual no CMT or tenderness with exam.  T OC gonorrhea  Plan: GC/Chlamydia culture pending. Condoms encouraged until permanent partner.

## 2013-12-19 NOTE — Patient Instructions (Signed)
Gonorrhea Gonorrhea is an infection that can cause serious problems. If left untreated, the infection may:   Damage the female or female organs.   Cause women to be unable to have children (sterility).   Harm a fetus if the infected woman is pregnant.  It is important to get treatment for gonorrhea as soon as possible. It is also necessary that all your sexual partners be tested for the infection.  CAUSES  Gonorrhea is caused by bacteria called Neisseria gonorrhoeae. The infection is spread from person to person, usually by sexual contact (such as by anal, vaginal, or oral means). A newborn can contract the infection from his or her mother during birth.  SYMPTOMS  Some people with gonorrhea do not have symptoms. Symptoms may be different in females and males.  Females The most common symptoms are:   Pain in the lower abdomen.   Fever with or without chills.  Other symptoms include:   Abnormal vaginal discharge.   Painful intercourse.   Burning or itching of the vagina or lips of the vagina.   Abnormal vaginal bleeding.   Pain when urinating.   Long-lasting (chronic) pain in the lower abdomen, especially during menstruation or intercourse.   Inability to become pregnant.   Going into premature labor.   Irritation, pain, bleeding, or discharge from the rectum. This may occur if the infection was spread by anal sex.   Sore throat or swollen lymph nodes in the neck. This may occur if the infection was spread by oral sex.  Males The most common symptoms are:   Discharge from the penis.   Pain or burning during urination.   Pain or swelling in the testicles. Other symptoms may include:   Irritation, pain, bleeding, or discharge from the rectum. This may occur if the infection was spread by anal sex.   Sore throat, fever, or swollen lymph nodes in the neck. This may occur if the infection was spread by oral sex.  DIAGNOSIS  A diagnosis is made after a  physical exam is done and a sample of discharge is examined under a microscope for the presence of the bacteria. The discharge may be taken from the urethra, cervix, throat, or rectum.  TREATMENT  Gonorrhea is treated with antibiotic medicines. It is important for treatment to begin as soon as possible. Early treatment may prevent some problems from developing.  HOME CARE INSTRUCTIONS   Take medicines only as directed by your health care provider.   Take your antibiotic medicine as directed by your health care provider. Finish the antibiotic even if you start to feel better. Incomplete treatment will put you at risk for continued infection.   Do not have sex until treatment is complete or as directed by your health care provider.   Keep all follow-up visits as directed by your health care provider.   Not all test results are available during your visit. If your test results are not back during the visit, make an appointment with your health care provider to find out the results. Do not assume everything is normal if you have not heard from your health care provider or the medical facility. It is your responsibility to get your test results.  If you test positive for gonorrhea, inform your recent sexual partners. They need to be checked for gonorrhea even if they do not have symptoms. They may need treatment, even if they test negative for gonorrhea.  SEEK MEDICAL CARE IF:   You develop any bad reaction   to the medicine you were prescribed. This may include:   A rash.   Nausea.   Vomiting.   Diarrhea.   Your symptoms do not improve after a few days of taking antibiotics.   Your symptoms get worse.   You develop increased pain, such as in the testicles (for males) or in the abdomen (for females).  You have a fever. MAKE SURE YOU:   Understand these instructions.  Will watch your condition.  Will get help right away if you are not doing well or get worse. Document  Released: 01/10/2000 Document Revised: 05/29/2013 Document Reviewed: 07/20/2012 ExitCare Patient Information 2015 [employer]. This information is not intended to replace advice given to you by your health care provider. Make sure you discuss any questions you have with your health care provider.  

## 2013-12-20 LAB — GC/CHLAMYDIA PROBE AMP
CT PROBE, AMP APTIMA: NEGATIVE
GC Probe RNA: NEGATIVE

## 2013-12-26 ENCOUNTER — Other Ambulatory Visit: Payer: Self-pay | Admitting: Gynecology

## 2013-12-26 ENCOUNTER — Telehealth: Payer: Self-pay | Admitting: Gynecology

## 2013-12-26 DIAGNOSIS — Z30431 Encounter for routine checking of intrauterine contraceptive device: Secondary | ICD-10-CM

## 2013-12-26 MED ORDER — LEVONORGESTREL 20 MCG/24HR IU IUD: INTRAUTERINE_SYSTEM | Freq: Once | INTRAUTERINE | Status: AC

## 2013-12-26 NOTE — Telephone Encounter (Signed)
12/26/13-Pt was informed today that her [employer] covers the Mirena & insertion at 100%, no copay. Appt made with JF for 12/27/13.wl

## 2013-12-27 ENCOUNTER — Encounter: Payer: Self-pay | Admitting: Gynecology

## 2013-12-27 ENCOUNTER — Ambulatory Visit (INDEPENDENT_AMBULATORY_CARE_PROVIDER_SITE_OTHER): Payer: Managed Care, Other (non HMO) | Admitting: Gynecology

## 2013-12-27 DIAGNOSIS — Z975 Presence of (intrauterine) contraceptive device: Secondary | ICD-10-CM | POA: Insufficient documentation

## 2013-12-27 DIAGNOSIS — Z30430 Encounter for insertion of intrauterine contraceptive device: Secondary | ICD-10-CM

## 2013-12-27 NOTE — Progress Notes (Signed)
    patient presented to the office today to have her Mirena IUD placed. Patient the past as try the oral contraceptive pill but has been an issue due to compliance. She try the NuvaRing did not like it and the Implanon did not work for her. She is currently on her menses. Review of her record indicated at this year she was treated for gonorrhea and the rest her STD screen was negative. On November 24 she return for test of cure and a GC and chlamydia culture were negative.  Patient was counseled as the risks benefits and pros and cons of the IUD. With her past history of STD her incisions could increase if she is not using condoms as well during intercourse. She is at risk for pelvic inflammatory disease and fertility issues in the future just based on her past history alone. Patient fully understands these facts and would like to have the Mirena IUD placed today.                                                                    IUD procedure note       Patient presented to the office today for placement of Mirena IUD. The patient had previously been provided with literature information on this method of contraception. The risks benefits and pros and cons were discussed and all her questions were answered. She is fully aware that this form of contraception is 99% effective and is good for 5 years.  Pelvic exam: Bartholin urethra Skene glands: Within normal limits Vagina: No lesions or discharge, menstrual blood present Cervix: No lesions or discharge Uterus: Anteverted position Adnexa: No masses or tenderness Rectal exam: Not done  The cervix was cleansed with Betadine solution. A single-tooth tenaculum was placed on the anterior cervical lip. The uterus sounded to 7 centimeter. The IUD was shown to the patient and inserted in a sterile fashion. The IUD string was trimmed. The single-tooth tenaculum was removed. Patient was instructed to return back to the office in one month for follow up.        Lot number [id number]

## 2013-12-27 NOTE — Patient Instructions (Signed)

## 2013-12-28 ENCOUNTER — Encounter: Payer: Self-pay | Admitting: Gynecology

## 2014-01-24 ENCOUNTER — Ambulatory Visit: Payer: Managed Care, Other (non HMO) | Admitting: Gynecology

## 2014-02-08 ENCOUNTER — Ambulatory Visit (INDEPENDENT_AMBULATORY_CARE_PROVIDER_SITE_OTHER): Payer: Managed Care, Other (non HMO) | Admitting: Gynecology

## 2014-02-08 ENCOUNTER — Encounter: Payer: Self-pay | Admitting: Gynecology

## 2014-02-08 VITALS — BP 132/80

## 2014-02-08 DIAGNOSIS — Z30431 Encounter for routine checking of intrauterine contraceptive device: Secondary | ICD-10-CM

## 2014-02-08 NOTE — Progress Notes (Signed)
   Patient presented to the office today for 1 month follow-up after having had the Mirena IUD placed. Patient in the past had tried several oral contraceptive pills but had issues with compliance. She tried the NuvaRing as well as the Implanon and did not like it. She reports some spotting but this IUD for the first few weeks but otherwise is doing well stated that her first menstrual cycle after having the Mirena IUD placed had decreased her blood flow considerably and less cramping. Her partner did feel the IUD string.  Exam: Bartholin urethra Skene was within normal limits Vagina: No lesions or discharge Cervix: IUD string visualized in the string was trimmed Bimanual exam: Uterus anteverted normal size shape and consistency Adnexa: No palpable mass or tenderness Rectal exam: Not done  Assessment/plan: Patient one month status post placement of Mirena IUD doing well. Patient scheduled for annual exam later this year.

## 2014-02-21 ENCOUNTER — Other Ambulatory Visit: Payer: Self-pay

## 2014-02-22 ENCOUNTER — Other Ambulatory Visit: Payer: Self-pay

## 2014-02-22 NOTE — Telephone Encounter (Signed)
Error -this refill encounter handled yesterday. I will contact pharmacy.

## 2014-08-29 ENCOUNTER — Ambulatory Visit (INDEPENDENT_AMBULATORY_CARE_PROVIDER_SITE_OTHER): Payer: Managed Care, Other (non HMO) | Admitting: Women's Health

## 2014-08-29 ENCOUNTER — Encounter: Payer: Self-pay | Admitting: Women's Health

## 2014-08-29 VITALS — BP 128/84

## 2014-08-29 DIAGNOSIS — N898 Other specified noninflammatory disorders of vagina: Secondary | ICD-10-CM

## 2014-08-29 DIAGNOSIS — A499 Bacterial infection, unspecified: Secondary | ICD-10-CM

## 2014-08-29 DIAGNOSIS — B9689 Other specified bacterial agents as the cause of diseases classified elsewhere: Secondary | ICD-10-CM

## 2014-08-29 DIAGNOSIS — Z113 Encounter for screening for infections with a predominantly sexual mode of transmission: Secondary | ICD-10-CM | POA: Diagnosis not present

## 2014-08-29 DIAGNOSIS — B3731 Acute candidiasis of vulva and vagina: Secondary | ICD-10-CM

## 2014-08-29 DIAGNOSIS — B373 Candidiasis of vulva and vagina: Secondary | ICD-10-CM

## 2014-08-29 DIAGNOSIS — B009 Herpesviral infection, unspecified: Secondary | ICD-10-CM | POA: Diagnosis not present

## 2014-08-29 DIAGNOSIS — N760 Acute vaginitis: Secondary | ICD-10-CM | POA: Diagnosis not present

## 2014-08-29 LAB — HEPATITIS B SURFACE ANTIGEN: Hepatitis B Surface Ag: NEGATIVE

## 2014-08-29 LAB — HEPATITIS C ANTIBODY: HCV AB: NEGATIVE

## 2014-08-29 LAB — WET PREP FOR TRICH, YEAST, CLUE
Trich, Wet Prep: NONE SEEN
YEAST WET PREP: NONE SEEN

## 2014-08-29 MED ORDER — METRONIDAZOLE 500 MG PO TABS
500.0000 mg | ORAL_TABLET | Freq: Two times a day (BID) | ORAL | Status: DC
Start: 2014-08-29 — End: 2014-12-05

## 2014-08-29 MED ORDER — VALACYCLOVIR HCL 500 MG PO TABS: ORAL_TABLET | ORAL | Status: DC

## 2014-08-29 MED ORDER — FLUCONAZOLE 150 MG PO TABS: 150.0000 mg | ORAL_TABLET | Freq: Once | ORAL | Status: DC

## 2014-08-29 NOTE — Patient Instructions (Signed)
Bacterial Vaginosis Bacterial vaginosis is a vaginal infection that occurs when the normal balance of bacteria in the vagina is disrupted. It results from an overgrowth of certain bacteria. This is the most common vaginal infection in women of childbearing age. Treatment is important to prevent complications, especially in pregnant women, as it can cause a premature delivery. CAUSES  Bacterial vaginosis is caused by an increase in harmful bacteria that are normally present in smaller amounts in the vagina. Several different kinds of bacteria can cause bacterial vaginosis. However, the reason that the condition develops is not fully understood. RISK FACTORS Certain activities or behaviors can put you at an increased risk of developing bacterial vaginosis, including:  Having a new sex partner or multiple sex partners.  Douching.  Using an intrauterine device (IUD) for contraception. Women do not get bacterial vaginosis from toilet seats, bedding, swimming pools, or contact with objects around them. SIGNS AND SYMPTOMS  Some women with bacterial vaginosis have no signs or symptoms. Common symptoms include:  Grey vaginal discharge.  A fishlike odor with discharge, especially after sexual intercourse.  Itching or burning of the vagina and vulva.  Burning or pain with urination. DIAGNOSIS  Your health care provider will take a medical history and examine the vagina for signs of bacterial vaginosis. A sample of vaginal fluid may be taken. Your health care provider will look at this sample under a microscope to check for bacteria and abnormal cells. A vaginal pH test may also be done.  TREATMENT  Bacterial vaginosis may be treated with antibiotic medicines. These may be given in the form of a pill or a vaginal cream. A second round of antibiotics may be prescribed if the condition comes back after treatment.  HOME CARE INSTRUCTIONS   Only take over-the-counter or prescription medicines as  directed by your health care provider.  If antibiotic medicine was prescribed, take it as directed. Make sure you finish it even if you start to feel better.  Do not have sex until treatment is completed.  Tell all sexual partners that you have a vaginal infection. They should see their health care provider and be treated if they have problems, such as a mild rash or itching.  Practice safe sex by using condoms and only having one sex partner. SEEK MEDICAL CARE IF:   Your symptoms are not improving after 3 days of treatment.  You have increased discharge or pain.  You have a fever. MAKE SURE YOU:   Understand these instructions.  Will watch your condition.  Will get help right away if you are not doing well or get worse. FOR MORE INFORMATION  Centers for Disease Control and Prevention, Division of STD Prevention: www.cdc.gov/std American Sexual Health Association (ASHA): www.ashastd.org  Document Released: 01/12/2005 Document Revised: 11/02/2012 Document Reviewed: 08/24/2012 Exit[employer] Patient Information 2015 [employer]. This information is not intended to replace advice given to you by your health care provider. Make sure you discuss any questions you have with your health care provider.  

## 2014-08-29 NOTE — Progress Notes (Signed)
Patient ID: Gabrielle Caldwell, female   DOB: 24-Dec-1980, 34 y.o.   MRN: [id number] Presents with complaint of increased discharge with questionable odor, and requesting STD screen, recently broke off engagement/questionable infidelity. Denies abdominal pain, urinary symptoms or fever. Mirena IUD with minimal bleeding. Recently completed amoxicillin for bronchitis.  Exam: Appears well. External genitalia within normal limits, speculum exam scant white discharge, wet prep positive for amines, clues, TNTC bacteria. GC/Chlamydia culture taken. Bimanual no CMT or adnexal tenderness.  Bacteria vaginosis STD screen  Plan: Flagyl 500 twice daily for 7 days #14, alcohol precautions reviewed. Diflucan 150 by mouth 1 dose if needed. GC/Chlamydia culture pending, HIV, hep B, C, RPR. Condoms encouraged if sexually active.

## 2014-08-30 LAB — HIV ANTIBODY (ROUTINE TESTING W REFLEX): HIV 1&2 Ab, 4th Generation: NONREACTIVE

## 2014-08-30 LAB — GC/CHLAMYDIA PROBE AMP
CT Probe RNA: NEGATIVE
GC PROBE AMP APTIMA: NEGATIVE

## 2014-08-30 LAB — RPR

## 2014-12-05 ENCOUNTER — Encounter: Payer: Self-pay | Admitting: Women's Health

## 2014-12-05 ENCOUNTER — Ambulatory Visit (INDEPENDENT_AMBULATORY_CARE_PROVIDER_SITE_OTHER): Payer: Managed Care, Other (non HMO) | Admitting: Women's Health

## 2014-12-05 VITALS — BP 122/80 | Ht 62.0 in | Wt 243.0 lb

## 2014-12-05 DIAGNOSIS — Z13220 Encounter for screening for lipoid disorders: Secondary | ICD-10-CM | POA: Diagnosis not present

## 2014-12-05 DIAGNOSIS — B009 Herpesviral infection, unspecified: Secondary | ICD-10-CM | POA: Diagnosis not present

## 2014-12-05 DIAGNOSIS — Z01419 Encounter for gynecological examination (general) (routine) without abnormal findings: Secondary | ICD-10-CM

## 2014-12-05 LAB — LIPID PANEL
CHOLESTEROL: 161 mg/dL (ref 125–200)
HDL: 42 mg/dL — AB (ref >=46)
LDL Cholesterol: 105 mg/dL (ref <130)
TRIGLYCERIDES: 71 mg/dL (ref <150)
Total CHOL/HDL Ratio: 3.8 Ratio (ref <=5.0)
VLDL: 14 mg/dL (ref <30)

## 2014-12-05 LAB — CBC WITH DIFFERENTIAL/PLATELET
BASOS ABS: 0 10*3/uL (ref 0.0–0.1)
BASOS PCT: 0 % (ref 0–1)
Eosinophils Absolute: 0.1 10*3/uL (ref 0.0–0.7)
Eosinophils Relative: 1 % (ref 0–5)
HCT: 36.1 % (ref 36.0–46.0)
HEMOGLOBIN: 12.1 g/dL (ref 12.0–15.0)
Lymphocytes Relative: 28 % (ref 12–46)
Lymphs Abs: 1.5 10*3/uL (ref 0.7–4.0)
MCH: 30.2 pg (ref 26.0–34.0)
MCHC: 33.5 g/dL (ref 30.0–36.0)
MCV: 90 fL (ref 78.0–100.0)
MONO ABS: 0.4 10*3/uL (ref 0.1–1.0)
MPV: 9.1 fL (ref 8.6–12.4)
Monocytes Relative: 7 % (ref 3–12)
NEUTROS ABS: 3.3 10*3/uL (ref 1.7–7.7)
NEUTROS PCT: 64 % (ref 43–77)
Platelets: 303 10*3/uL (ref 150–400)
RBC: 4.01 MIL/uL (ref 3.87–5.11)
RDW: 13.3 % (ref 11.5–15.5)
WBC: 5.2 10*3/uL (ref 4.0–10.5)

## 2014-12-05 LAB — URINALYSIS W MICROSCOPIC + REFLEX CULTURE
BACTERIA UA: NONE SEEN [HPF]
BILIRUBIN URINE: NEGATIVE
CASTS: NONE SEEN [LPF]
CRYSTALS: NONE SEEN [HPF]
Glucose, UA: NEGATIVE
Hgb urine dipstick: NEGATIVE
Ketones, ur: NEGATIVE
Leukocytes, UA: NEGATIVE
Nitrite: NEGATIVE
PROTEIN: NEGATIVE
RBC / HPF: NONE SEEN RBC/HPF (ref <=2)
SPECIFIC GRAVITY, URINE: 1.017 (ref 1.001–1.035)
WBC, UA: NONE SEEN WBC/HPF (ref <=5)
YEAST: NONE SEEN [HPF]
pH: 6.5 (ref 5.0–8.0)

## 2014-12-05 LAB — GLUCOSE, RANDOM: Glucose, Bld: 79 mg/dL (ref 65–99)

## 2014-12-05 MED ORDER — VALACYCLOVIR HCL 500 MG PO TABS: ORAL_TABLET | ORAL | Status: DC

## 2014-12-05 NOTE — Progress Notes (Signed)
Gabrielle Caldwell [birth year] [id number]    History:    Presents for annual exam.  Rare bleeding on Mirena IUD placed 12/2013. HSV rare outbreaks. 2001, 2002 LGSIL cryo-normal Paps after. Same partner/negative STD screen.  Past medical history, past surgical history, family history and social history were all reviewed and documented in the EPIC chart. Works for [employer], Gabrielle Caldwell 6 doing well. 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 Father diabetes.  ROS:  A ROS was performed and pertinent positives and negatives are included.  Exam:  Filed Vitals:   12/05/14 0928  BP: 122/80    General appearance:  Normal Thyroid:  Symmetrical, normal in size, without palpable masses or nodularity. Respiratory  Auscultation:  Clear without wheezing or rhonchi Cardiovascular  Auscultation:  Regular rate, without rubs, murmurs or gallops  Edema/varicosities:  Not grossly evident Abdominal  Soft,nontender, without masses, guarding or rebound.  Liver/spleen:  No organomegaly noted  Hernia:  None appreciated  Skin  Inspection:  Grossly normal   Breasts: Examined lying and sitting.     Right: Without masses, retractions, discharge or axillary adenopathy.     Left: Without masses, retractions, discharge or axillary adenopathy. Gentitourinary   Inguinal/mons:  Normal without inguinal adenopathy  External genitalia:  Normal  BUS/Urethra/Skene's glands:  Normal  Vagina:  Normal  Cervix:  Normal  Uterus:  normal in size, shape and contour.  Midline and mobile  Adnexa/parametria:     Rt: Without masses or tenderness.   Lt: Without masses or tenderness.  Anus and perineum: Normal  Digital rectal exam: Normal sphincter tone without palpated masses or tenderness  Assessment/Plan:  34 y.o. SBF G1 P1 for annual exam with no complaints.  Rare bleeding Mirena IUD 12/2013 Obesity HSV-rare outbreaks  Plan: SBE's, increase exercise and decrease calories, reviewed importance of weight loss for health. CBC,  glucose, lipid panel,, UA, Pap normal 2015, new screening guidelines reviewed.  [name], 1:38 PM 12/05/2014

## 2014-12-05 NOTE — Patient Instructions (Signed)
Diabetes Mellitus and Food It is important for you to manage your blood sugar (glucose) level. Your blood glucose level can be greatly affected by what you eat. Eating healthier foods in the appropriate amounts throughout the day at about the same time each day will help you control your blood glucose level. It can also help slow or prevent worsening of your diabetes mellitus. Healthy eating may even help you improve the level of your blood pressure and reach or maintain a healthy weight.  General recommendations for healthful eating and cooking habits include:  Eating meals and snacks regularly. Avoid going long periods of time without eating to lose weight.  Eating a diet that consists mainly of plant-based foods, such as fruits, vegetables, nuts, legumes, and whole grains.  Using low-heat cooking methods, such as baking, instead of high-heat cooking methods, such as deep frying. Work with your dietitian to make sure you understand how to use the Nutrition Facts information on food labels. HOW CAN FOOD AFFECT ME? Carbohydrates Carbohydrates affect your blood glucose level more than any other type of food. Your dietitian will help you determine how many carbohydrates to eat at each meal and teach you how to count carbohydrates. Counting carbohydrates is important to keep your blood glucose at a healthy level, especially if you are using insulin or taking certain medicines for diabetes mellitus. Alcohol Alcohol can cause sudden decreases in blood glucose (hypoglycemia), especially if you use insulin or take certain medicines for diabetes mellitus. Hypoglycemia can be a life-threatening condition. Symptoms of hypoglycemia (sleepiness, dizziness, and disorientation) are similar to symptoms of having too much alcohol.  If your health care provider has given you approval to drink alcohol, do so in moderation and use the following guidelines:  Women should not have more than one drink per day, and men  should not have more than two drinks per day. One drink is equal to:  12 oz of beer.  5 oz of wine.  1 oz of hard liquor.  Do not drink on an empty stomach.  Keep yourself hydrated. Have water, diet soda, or unsweetened iced tea.  Regular soda, juice, and other mixers might contain a lot of carbohydrates and should be counted. WHAT FOODS ARE NOT RECOMMENDED? As you make food choices, it is important to remember that all foods are not the same. Some foods have fewer nutrients per serving than other foods, even though they might have the same number of calories or carbohydrates. It is difficult to get your body what it needs when you eat foods with fewer nutrients. Examples of foods that you should avoid that are high in calories and carbohydrates but low in nutrients include:  Trans fats (most processed foods list trans fats on the Nutrition Facts label).  Regular soda.  Juice.  Candy.  Sweets, such as cake, pie, doughnuts, and cookies.  Fried foods. WHAT FOODS CAN I EAT? Eat nutrient-rich foods, which will nourish your body and keep you healthy. The food you should eat also will depend on several factors, including:  The calories you need.  The medicines you take.  Your weight.  Your blood glucose level.  Your blood pressure level.  Your cholesterol level. You should eat a variety of foods, including:  Protein.  Lean cuts of meat.  Proteins low in saturated fats, such as fish, egg whites, and beans. Avoid processed meats.  Fruits and vegetables.  Fruits and vegetables that may help control blood glucose levels, such as apples, mangoes, and  yams.  Dairy products.  Choose fat-free or low-fat dairy products, such as milk, yogurt, and cheese.  Grains, bread, pasta, and rice.  Choose whole grain products, such as multigrain bread, whole oats, and brown rice. These foods may help control blood pressure.  Fats.  Foods containing healthful fats, such as nuts,  avocado, olive oil, canola oil, and fish. DOES EVERYONE WITH DIABETES MELLITUS HAVE THE SAME MEAL PLAN? Because every person with diabetes mellitus is different, there is not one meal plan that works for everyone. It is very important that you meet with a dietitian who will help you create a meal plan that is just right for you.   This information is not intended to replace advice given to you by your health care provider. Make sure you discuss any questions you have with your health care provider.   Document Released: 10/09/2004 Document Revised: 02/02/2014 Document Reviewed: 12/09/2012 Elsevier Interactive Patient Education 2016 Elsevier Inc[employer] Health Maintenance, Female Adopting a healthy lifestyle and getting preventive care can go a long way to promote health and wellness. Talk with your health care provider about what schedule of regular examinations is right for you. This is a good chance for you to check in with your provider about disease prevention and staying healthy. In between checkups, there are plenty of things you can do on your own. Experts have done a lot of research about which lifestyle changes and preventive measures are most likely to keep you healthy. Ask your health care provider for more information. WEIGHT AND DIET  Eat a healthy diet  Be sure to include plenty of vegetables, fruits, low-fat dairy products, and lean protein.  Do not eat a lot of foods high in solid fats, added sugars, or salt.  Get regular exercise. This is one of the most important things you can do for your health.  Most adults should exercise for at least 150 minutes each week. The exercise should increase your heart rate and make you sweat (moderate-intensity exercise).  Most adults should also do strengthening exercises at least twice a week. This is in addition to the moderate-intensity exercise.  Maintain a healthy weight  Body mass index (BMI) is a measurement that can be used to identify  possible weight problems. It estimates body fat based on height and weight. Your health care provider can help determine your BMI and help you achieve or maintain a healthy weight.  For females 20 years of age and older:   A BMI below 18.5 is considered underweight.  A BMI of 18.5 to 24.9 is normal.  A BMI of 25 to 29.9 is considered overweight.  A BMI of 30 and above is considered obese.  Watch levels of cholesterol and blood lipids  You should start having your blood tested for lipids and cholesterol at 32 years of age, then have this test every 5 years.  You may need to have your cholesterol levels checked more often if:  Your lipid or cholesterol levels are high.  You are older than 32 years of age.  You are at high risk for heart disease.  CANCER SCREENING   Lung Cancer  Lung cancer screening is recommended for adults 55-80 years old who are at high risk for lung cancer because of a history of smoking.  A yearly low-dose CT scan of the lungs is recommended for people who:  Currently smoke.  Have quit within the past 15 years.  Have at least a 30-pack-year history of smoking. A   pack year is smoking an average of one pack of cigarettes a day for 1 year.  Yearly screening should continue until it has been 15 years since you quit.  Yearly screening should stop if you develop a health problem that would prevent you from having lung cancer treatment.  Breast Cancer  Practice breast self-awareness. This means understanding how your breasts normally appear and feel.  It also means doing regular breast self-exams. Let your health care provider know about any changes, no matter how small.  If you are in your 20s or 30s, you should have a clinical breast exam (CBE) by a health care provider every 1-3 years as part of a regular health exam.  If you are 28 or older, have a CBE every year. Also consider having a breast X-ray (mammogram) every year.  If you have a family  history of breast cancer, talk to your health care provider about genetic screening.  If you are at high risk for breast cancer, talk to your health care provider about having an MRI and a mammogram every year.  Breast cancer gene (BRCA) assessment is recommended for women who have family members with BRCA-related cancers. BRCA-related cancers include:  Breast.  Ovarian.  Tubal.  Peritoneal cancers.  Results of the assessment will determine the need for genetic counseling and BRCA1 and BRCA2 testing. Cervical Cancer Your health care provider may recommend that you be screened regularly for cancer of the pelvic organs (ovaries, uterus, and vagina). This screening involves a pelvic examination, including checking for microscopic changes to the surface of your cervix (Pap test). You may be encouraged to have this screening done every 3 years, beginning at age 2.  For women ages 88-65, health care providers may recommend pelvic exams and Pap testing every 3 years, or they may recommend the Pap and pelvic exam, combined with testing for human papilloma virus (HPV), every 5 years. Some types of HPV increase your risk of cervical cancer. Testing for HPV may also be done on women of any age with unclear Pap test results.  Other health care providers may not recommend any screening for nonpregnant women who are considered low risk for pelvic cancer and who do not have symptoms. Ask your health care provider if a screening pelvic exam is right for you.  If you have had past treatment for cervical cancer or a condition that could lead to cancer, you need Pap tests and screening for cancer for at least 20 years after your treatment. If Pap tests have been discontinued, your risk factors (such as having a new sexual partner) need to be reassessed to determine if screening should resume. Some women have medical problems that increase the chance of getting cervical cancer. In these cases, your health care  provider may recommend more frequent screening and Pap tests. Colorectal Cancer  This type of cancer can be detected and often prevented.  Routine colorectal cancer screening usually begins at 32 years of age and continues through 32 years of age.  Your health care provider may recommend screening at an earlier age if you have risk factors for colon cancer.  Your health care provider may also recommend using home test kits to check for hidden blood in the stool.  A small camera at the end of a tube can be used to examine your colon directly (sigmoidoscopy or colonoscopy). This is done to check for the earliest forms of colorectal cancer.  Routine screening usually begins at age 33.  Direct  examination of the colon should be repeated every 5-10 years through 32 years of age. However, you may need to be screened more often if early forms of precancerous polyps or small growths are found. Skin Cancer  Check your skin from head to toe regularly.  Tell your health care provider about any new moles or changes in moles, especially if there is a change in a mole's shape or color.  Also tell your health care provider if you have a mole that is larger than the size of a pencil eraser.  Always use sunscreen. Apply sunscreen liberally and repeatedly throughout the day.  Protect yourself by wearing long sleeves, pants, a wide-brimmed hat, and sunglasses whenever you are outside. HEART DISEASE, DIABETES, AND HIGH BLOOD PRESSURE   High blood pressure causes heart disease and increases the risk of stroke. High blood pressure is more likely to develop in:  People who have blood pressure in the high end of the normal range (130-139/85-89 mm Hg).  People who are overweight or obese.  People who are African American.  If you are 2-50 years of age, have your blood pressure checked every 3-5 years. If you are 61 years of age or older, have your blood pressure checked every year. You should have your  blood pressure measured twice--once when you are at a hospital or clinic, and once when you are not at a hospital or clinic. Record the average of the two measurements. To check your blood pressure when you are not at a hospital or clinic, you can use:  An automated blood pressure machine at a pharmacy.  A home blood pressure monitor.  If you are between 64 years and 25 years old, ask your health care provider if you should take aspirin to prevent strokes.  Have regular diabetes screenings. This involves taking a blood sample to check your fasting blood sugar level.  If you are at a normal weight and have a low risk for diabetes, have this test once every three years after 32 years of age.  If you are overweight and have a high risk for diabetes, consider being tested at a younger age or more often. PREVENTING INFECTION  Hepatitis B  If you have a higher risk for hepatitis B, you should be screened for this virus. You are considered at high risk for hepatitis B if:  You were born in a country where hepatitis B is common. Ask your health care provider which countries are considered high risk.  Your parents were born in a high-risk country, and you have not been immunized against hepatitis B (hepatitis B vaccine).  You have HIV or AIDS.  You use needles to inject street drugs.  You live with someone who has hepatitis B.  You have had sex with someone who has hepatitis B.  You get hemodialysis treatment.  You take certain medicines for conditions, including cancer, organ transplantation, and autoimmune conditions. Hepatitis C  Blood testing is recommended for:  Everyone born from [birth year] through 1963.  Anyone with known risk factors for hepatitis C. Sexually transmitted infections (STIs)  You should be screened for sexually transmitted infections (STIs) including gonorrhea and chlamydia if:  You are sexually active and are younger than 32 years of age.  You are older than 32  years of age and your health care provider tells you that you are at risk for this type of infection.  Your sexual activity has changed since you were last screened and you are at  an increased risk for chlamydia or gonorrhea. Ask your health care provider if you are at risk.  If you do not have HIV, but are at risk, it may be recommended that you take a prescription medicine daily to prevent HIV infection. This is called pre-exposure prophylaxis (PrEP). You are considered at risk if:  You are sexually active and do not regularly use condoms or know the HIV status of your partner(s).  You take drugs by injection.  You are sexually active with a partner who has HIV. Talk with your health care provider about whether you are at high risk of being infected with HIV. If you choose to begin PrEP, you should first be tested for HIV. You should then be tested every 3 months for as long as you are taking PrEP.  PREGNANCY   If you are premenopausal and you may become pregnant, ask your health care provider about preconception counseling.  If you may become pregnant, take 400 to 800 micrograms (mcg) of folic acid every day.  If you want to prevent pregnancy, talk to your health care provider about birth control (contraception). OSTEOPOROSIS AND MENOPAUSE   Osteoporosis is a disease in which the bones lose minerals and strength with aging. This can result in serious bone fractures. Your risk for osteoporosis can be identified using a bone density scan.  If you are 94 years of age or older, or if you are at risk for osteoporosis and fractures, ask your health care provider if you should be screened.  Ask your health care provider whether you should take a calcium or vitamin D supplement to lower your risk for osteoporosis.  Menopause may have certain physical symptoms and risks.  Hormone replacement therapy may reduce some of these symptoms and risks. Talk to your health care provider about whether  hormone replacement therapy is right for you.  HOME CARE INSTRUCTIONS   Schedule regular health, dental, and eye exams.  Stay current with your immunizations.   Do not use any tobacco products including cigarettes, chewing tobacco, or electronic cigarettes.  If you are pregnant, do not drink alcohol.  If you are breastfeeding, limit how much and how often you drink alcohol.  Limit alcohol intake to no more than 1 drink per day for nonpregnant women. One drink equals 12 ounces of beer, 5 ounces of wine, or 1 ounces of hard liquor.  Do not use street drugs.  Do not share needles.  Ask your health care provider for help if you need support or information about quitting drugs.  Tell your health care provider if you often feel depressed.  Tell your health care provider if you have ever been abused or do not feel safe at home.   This information is not intended to replace advice given to you by your health care provider. Make sure you discuss any questions you have with your health care provider.   Document Released: 07/28/2010 Document Revised: 02/02/2014 Document Reviewed: 12/14/2012 Elsevier Interactive Patient Education [employer].

## 2015-12-10 ENCOUNTER — Ambulatory Visit (INDEPENDENT_AMBULATORY_CARE_PROVIDER_SITE_OTHER): Payer: 59 | Admitting: Women's Health

## 2015-12-10 ENCOUNTER — Encounter: Payer: Self-pay | Admitting: Women's Health

## 2015-12-10 VITALS — BP 120/76 | Ht 62.5 in | Wt 237.0 lb

## 2015-12-10 DIAGNOSIS — Z01419 Encounter for gynecological examination (general) (routine) without abnormal findings: Secondary | ICD-10-CM

## 2015-12-10 DIAGNOSIS — B009 Herpesviral infection, unspecified: Secondary | ICD-10-CM | POA: Diagnosis not present

## 2015-12-10 DIAGNOSIS — Z1329 Encounter for screening for other suspected endocrine disorder: Secondary | ICD-10-CM | POA: Diagnosis not present

## 2015-12-10 DIAGNOSIS — Z13220 Encounter for screening for lipoid disorders: Secondary | ICD-10-CM

## 2015-12-10 DIAGNOSIS — Z1151 Encounter for screening for human papillomavirus (HPV): Secondary | ICD-10-CM

## 2015-12-10 LAB — COMPREHENSIVE METABOLIC PANEL
ALK PHOS: 61 U/L (ref 33–115)
ALT: 16 U/L (ref 6–29)
AST: 17 U/L (ref 10–30)
Albumin: 3.7 g/dL (ref 3.6–5.1)
BILIRUBIN TOTAL: 0.4 mg/dL (ref 0.2–1.2)
BUN: 11 mg/dL (ref 7–25)
CALCIUM: 8.5 mg/dL — AB (ref 8.6–10.2)
CO2: 25 mmol/L (ref 20–31)
CREATININE: 1.01 mg/dL (ref 0.50–1.10)
Chloride: 107 mmol/L (ref 98–110)
Glucose, Bld: 92 mg/dL (ref 65–99)
POTASSIUM: 3.8 mmol/L (ref 3.5–5.3)
Sodium: 139 mmol/L (ref 135–146)
Total Protein: 6.4 g/dL (ref 6.1–8.1)

## 2015-12-10 LAB — CBC WITH DIFFERENTIAL/PLATELET
Basophils Absolute: 0 cells/uL (ref 0–200)
Basophils Relative: 0 %
EOS ABS: 80 {cells}/uL (ref 15–500)
Eosinophils Relative: 2 %
HEMATOCRIT: 36.2 % (ref 35.0–45.0)
Hemoglobin: 12.1 g/dL (ref 11.7–15.5)
LYMPHS PCT: 32 %
Lymphs Abs: 1280 cells/uL (ref 850–3900)
MCH: 30.5 pg (ref 27.0–33.0)
MCHC: 33.4 g/dL (ref 32.0–36.0)
MCV: 91.2 fL (ref 80.0–100.0)
MONO ABS: 280 {cells}/uL (ref 200–950)
MONOS PCT: 7 %
MPV: 8.8 fL (ref 7.5–12.5)
NEUTROS PCT: 59 %
Neutro Abs: 2360 cells/uL (ref 1500–7800)
PLATELETS: 312 10*3/uL (ref 140–400)
RBC: 3.97 MIL/uL (ref 3.80–5.10)
RDW: 13.8 % (ref 11.0–15.0)
WBC: 4 10*3/uL (ref 3.8–10.8)

## 2015-12-10 LAB — LIPID PANEL
CHOLESTEROL: 155 mg/dL (ref <200)
HDL: 47 mg/dL — ABNORMAL LOW (ref >50)
LDL Cholesterol: 100 mg/dL — ABNORMAL HIGH (ref <100)
TRIGLYCERIDES: 42 mg/dL (ref <150)
Total CHOL/HDL Ratio: 3.3 Ratio (ref <5.0)
VLDL: 8 mg/dL (ref <30)

## 2015-12-10 LAB — TSH: TSH: 1.01 mIU/L

## 2015-12-10 MED ORDER — VALACYCLOVIR HCL 500 MG PO TABS: ORAL_TABLET | ORAL | 12 refills | Status: DC

## 2015-12-10 NOTE — Patient Instructions (Signed)

## 2015-12-10 NOTE — Progress Notes (Signed)
Gabrielle Caldwell 1980-11-12 [id number]    History:    Presents for annual exam. Rare bleeding on Mirena IUD placed 12/2013. 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 Same partner/negative STD screen. Denies discharge, bleeding, or urinary symptoms. 2001, 2002 LGSIL with cryo-normal Paps after.  Past medical history, past surgical history, family history and social history were all reviewed and documented in the EPIC chart. Patient is adopted and family history is unknown. Works for [employer]. One son, Gabrielle Caldwell 5, in kindergarten doing well  ROS:  A ROS was performed and pertinent positives and negatives are included.  Exam:  Vitals:   12/10/15 0847  BP: 120/76  Weight: 237 lb (107.5 kg)  Height: 5' 2.5" (1.588 m)   Body mass index is 42.66 kg/m.   General appearance:  Normal Thyroid:  Symmetrical, normal in size, without palpable masses or nodularity. Respiratory  Auscultation:  Clear without wheezing or rhonchi Cardiovascular  Auscultation:  Regular rate, without rubs, murmurs or gallops  Edema/varicosities:  Not grossly evident Abdominal  Soft,nontender, without masses, guarding or rebound.  Liver/spleen:  No organomegaly noted  Hernia:  None appreciated  Skin  Inspection:  Grossly normal   Breasts: Examined lying and sitting.     Right: Without masses, retractions, discharge or axillary adenopathy.     Left: Without masses, retractions, discharge or axillary adenopathy. Gentitourinary   Inguinal/mons:  Normal without inguinal adenopathy  External genitalia:  Normal  BUS/Urethra/Skene's glands:  Normal  Vagina:  Normal  Cervix:  Normal IUD strings visible  Uterus:  verted, normal in size, shape and contour.  Midline and mobile  Adnexa/parametria:     Rt: Without masses or tenderness.   Lt: Without masses or tenderness.  Anus and perineum: Normal  Digital rectal exam: Normal sphincter tone without palpated masses or tenderness  Assessment/Plan:  35 y.o.  SBF  G1P1 for annual exam with no complaints.  Rare bleeding on Mirena IUD 12/15 Obesity HSV- rare outbreaks Valtrex as needed  Plan: Valtrex 500 mg twice daily for 3-5 days as needed. Prescription given. SBE's, increase exercised and decrease calories, reviewed importance of weight loss for health. CBC, CMP, lipid panel, TSH, UA-culture pending. Pap with HR HPV typing    Huel Cote [facility], 9:19 AM 12/10/2015

## 2015-12-10 NOTE — Addendum Note (Signed)
Addended by: Nelva Nay on: 12/10/2015 10:09 AM   Modules accepted: Orders

## 2015-12-11 LAB — URINALYSIS W MICROSCOPIC + REFLEX CULTURE
BACTERIA UA: NONE SEEN [HPF]
Bilirubin Urine: NEGATIVE
CRYSTALS: NONE SEEN [HPF]
Casts: NONE SEEN [LPF]
Glucose, UA: NEGATIVE
Hgb urine dipstick: NEGATIVE
Ketones, ur: NEGATIVE
Leukocytes, UA: NEGATIVE
Nitrite: NEGATIVE
PROTEIN: NEGATIVE
RBC / HPF: NONE SEEN RBC/HPF (ref <=2)
Specific Gravity, Urine: 1.023 (ref 1.001–1.035)
Squamous Epithelial / LPF: NONE SEEN [HPF] (ref <=5)
WBC, UA: NONE SEEN WBC/HPF (ref <=5)
YEAST: NONE SEEN [HPF]
pH: 6 (ref 5.0–8.0)

## 2015-12-16 LAB — PAP, TP IMAGING W/ HPV RNA, RFLX HPV TYPE 16,18/45: HPV mRNA, High Risk: NOT DETECTED

## 2016-02-26 ENCOUNTER — Other Ambulatory Visit: Payer: Self-pay | Admitting: Women's Health

## 2016-02-26 DIAGNOSIS — B009 Herpesviral infection, unspecified: Secondary | ICD-10-CM

## 2016-06-10 ENCOUNTER — Encounter: Payer: Self-pay | Admitting: Gynecology

## 2016-11-16 ENCOUNTER — Encounter: Payer: Self-pay | Admitting: Women's Health

## 2016-11-16 ENCOUNTER — Ambulatory Visit (INDEPENDENT_AMBULATORY_CARE_PROVIDER_SITE_OTHER): Payer: 59 | Admitting: Women's Health

## 2016-11-16 VITALS — BP 122/80 | Ht 62.0 in | Wt 228.0 lb

## 2016-11-16 DIAGNOSIS — Z113 Encounter for screening for infections with a predominantly sexual mode of transmission: Secondary | ICD-10-CM | POA: Diagnosis not present

## 2016-11-16 DIAGNOSIS — K640 First degree hemorrhoids: Secondary | ICD-10-CM

## 2016-11-16 DIAGNOSIS — Z01419 Encounter for gynecological examination (general) (routine) without abnormal findings: Secondary | ICD-10-CM

## 2016-11-16 DIAGNOSIS — B009 Herpesviral infection, unspecified: Secondary | ICD-10-CM

## 2016-11-16 MED ORDER — HYDROCORTISONE ACE-PRAMOXINE 2.5-1 % RE CREA: 1.0000 "application " | TOPICAL_CREAM | Freq: Three times a day (TID) | RECTAL | 1 refills | Status: DC

## 2016-11-16 MED ORDER — VALACYCLOVIR HCL 500 MG PO TABS: ORAL_TABLET | ORAL | 7 refills | Status: DC

## 2016-11-16 NOTE — Patient Instructions (Signed)

## 2016-11-16 NOTE — Progress Notes (Signed)
Gabrielle Caldwell 10-Jun-1980 [id number]    History:    Presents for annual exam.  12/2013 Mirena IUD with rare spotting. New partner. 2002 LGSIL with cryo-with normal Paps after. Adopted, unknown family history.   Past medical history, past surgical history, family history and social history were all reviewed and documented in the EPIC chart. Gabrielle Caldwell 6 doing well.Laid off from [employer] [place]. Desires no more children.   ROS:  A ROS was performed and pertinent positives and negatives are included.  Exam:  Vitals:   11/16/16 1433  BP: 122/80  Weight: 228 lb (103.4 kg)  Height: 5\' 2"  (1.575 m)   Body mass index is 41.7 kg/m.   General appearance:  Normal Thyroid:  Symmetrical, normal in size, without palpable masses or nodularity. Respiratory  Auscultation:  Clear without wheezing or rhonchi Cardiovascular  Auscultation:  Regular rate, without rubs, murmurs or gallops  Edema/varicosities:  Not grossly evident Abdominal  Soft,nontender, without masses, guarding or rebound.  Liver/spleen:  No organomegaly noted  Hernia:  None appreciated  Skin  Inspection:  Grossly normal   Breasts: Examined lying and sitting.     Right: Without masses, retractions, discharge or axillary adenopathy.     Left: Without masses, retractions, discharge or axillary adenopathy. Gentitourinary   Inguinal/mons:  Normal without inguinal adenopathy  External genitalia:  Normal  BUS/Urethra/Skene's glands:  Normal  Vagina:  Normal  Cervix:  Normal IUD strings in os  Uterus:   normal in size, shape and contour.  Midline and mobile  Adnexa/parametria:     Rt: Without masses or tenderness.   Lt: Without masses or tenderness.  Anus and perineum: Small hemorrhoid  Digital rectal exam: Normal sphincter tone without palpated masses or tenderness  Assessment/Plan:  36 y.o. SBF G2 P1 for annual exam with no complaints other than occasional hemorrhoidal pain  12/2013 Mirena IUD rare spotting STD  screen 2002 LGSIL cryo-normal Paps after Obesity HSV rare outbreaks  Plan: SBE's, exercise, calcium rich diet, MVI daily encouraged. Encouraged increased fiber and fluids and diet, Analpram HC prescription, proper use given and reviewed. Valtrex 500 twice daily for 3-5 days as needed. CBC, glucose, GC/Chlamydia, HIV, hep B, C, RPR. Pap normal with negative  HR HPV 2017, new screening guidelines reviewed.    [place], 3:04 PM 11/16/2016

## 2016-11-17 LAB — HIV ANTIBODY (ROUTINE TESTING W REFLEX): HIV 1&2 Ab, 4th Generation: NONREACTIVE

## 2016-11-17 LAB — CBC WITH DIFFERENTIAL/PLATELET
Basophils Absolute: 22 cells/uL (ref 0–200)
Basophils Relative: 0.5 %
EOS ABS: 48 {cells}/uL (ref 15–500)
Eosinophils Relative: 1.1 %
HCT: 36 % (ref 35.0–45.0)
Hemoglobin: 12.2 g/dL (ref 11.7–15.5)
Lymphs Abs: 1518 cells/uL (ref 850–3900)
MCH: 30.6 pg (ref 27.0–33.0)
MCHC: 33.9 g/dL (ref 32.0–36.0)
MCV: 90.2 fL (ref 80.0–100.0)
MPV: 9.5 fL (ref 7.5–12.5)
Monocytes Relative: 8.8 %
NEUTROS PCT: 55.1 %
Neutro Abs: 2424 cells/uL (ref 1500–7800)
PLATELETS: 302 10*3/uL (ref 140–400)
RBC: 3.99 10*6/uL (ref 3.80–5.10)
RDW: 12.4 % (ref 11.0–15.0)
TOTAL LYMPHOCYTE: 34.5 %
WBC: 4.4 10*3/uL (ref 3.8–10.8)
WBCMIX: 387 {cells}/uL (ref 200–950)

## 2016-11-17 LAB — C. TRACHOMATIS/N. GONORRHOEAE RNA
C. trachomatis RNA, TMA: NOT DETECTED
N. GONORRHOEAE RNA, TMA: NOT DETECTED

## 2016-11-17 LAB — RPR: RPR Ser Ql: NONREACTIVE

## 2016-11-17 LAB — HEPATITIS C ANTIBODY
HEP C AB: NONREACTIVE
SIGNAL TO CUT-OFF: 0.01 (ref <1.00)

## 2016-11-17 LAB — GLUCOSE, RANDOM: Glucose, Bld: 88 mg/dL (ref 65–99)

## 2016-11-17 LAB — HEPATITIS B SURFACE ANTIGEN: HEP B S AG: NONREACTIVE

## 2016-12-14 ENCOUNTER — Encounter: Payer: 59 | Admitting: Women's Health

## 2017-07-12 ENCOUNTER — Ambulatory Visit (INDEPENDENT_AMBULATORY_CARE_PROVIDER_SITE_OTHER): Payer: 59 | Admitting: Women's Health

## 2017-07-12 ENCOUNTER — Encounter: Payer: Self-pay | Admitting: Women's Health

## 2017-07-12 ENCOUNTER — Telehealth: Payer: Self-pay | Admitting: *Deleted

## 2017-07-12 VITALS — BP 118/80 | Ht 62.0 in | Wt 246.0 lb

## 2017-07-12 DIAGNOSIS — N6310 Unspecified lump in the right breast, unspecified quadrant: Secondary | ICD-10-CM | POA: Diagnosis not present

## 2017-07-12 DIAGNOSIS — N63 Unspecified lump in unspecified breast: Secondary | ICD-10-CM

## 2017-07-12 NOTE — Telephone Encounter (Signed)
Appointment on 07/13/17 @ 8:10am at [facility] [place]. Pt informed.

## 2017-07-12 NOTE — Progress Notes (Signed)
37 yo SBF G2P1 presents with complaint of right tender breast lump noted 3 weeks ago but has started to decrease in size.  Not change in routine, injury or nipple discharge.  Minimal family history r/t adoption. Biological maternal grandmother died from cervical cancer. Requesting BRCA testing. Mirena IUD with monthly spotting x3 days. Same partner/STD screen.  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  Exam: Appears well. Bilateral breast, pendulous. No visible retraction, dimpling, erytherma, nipple diischarge. Right breast 2 cm mobile lump at 9 o'clock just outside areola.   Right breast lump   Plan: 3D mammogram/US. Discussed breast cancer risks and why BRCA screening is not indicated. Condolences given for nonbiological father who died recently at age 40 from pancreatic cancer. Adopted mother doing well and staying very active with her sisters.

## 2017-07-12 NOTE — Telephone Encounter (Signed)
-----   Message from Huel Cote, NP sent at 07/12/2017 10:48 AM EDT ----- If possible today diagnostic mammogram and [place] rt breast at 9  Near areola, 2-3 cm mobile mass. adopted

## 2017-07-13 ENCOUNTER — Other Ambulatory Visit: Payer: Managed Care, Other (non HMO)

## 2017-07-30 ENCOUNTER — Other Ambulatory Visit: Payer: Self-pay | Admitting: Women's Health

## 2017-07-30 ENCOUNTER — Ambulatory Visit
Admission: RE | Admit: 2017-07-30 | Discharge: 2017-07-30 | Disposition: A | Discharge status: Home / Self Care | Payer: 59 | Source: Ambulatory Visit | Attending: Women's Health | Admitting: Women's Health

## 2017-07-30 ENCOUNTER — Ambulatory Visit
Admission: RE | Admit: 2017-07-30 | Discharge: 2017-07-30 | Disposition: A | Discharge status: Home / Self Care | Payer: Managed Care, Other (non HMO) | Source: Ambulatory Visit | Attending: Women's Health | Admitting: Women's Health

## 2017-07-30 DIAGNOSIS — N63 Unspecified lump in unspecified breast: Secondary | ICD-10-CM

## 2017-07-30 DIAGNOSIS — N6310 Unspecified lump in the right breast, unspecified quadrant: Secondary | ICD-10-CM

## 2017-08-13 ENCOUNTER — Other Ambulatory Visit: Payer: Self-pay | Admitting: Women's Health

## 2017-08-13 ENCOUNTER — Ambulatory Visit
Admission: RE | Admit: 2017-08-13 | Discharge: 2017-08-13 | Disposition: A | Discharge status: Home / Self Care | Payer: 59 | Source: Ambulatory Visit | Attending: Women's Health | Admitting: Women's Health

## 2017-08-13 DIAGNOSIS — N6310 Unspecified lump in the right breast, unspecified quadrant: Secondary | ICD-10-CM

## 2017-09-06 ENCOUNTER — Ambulatory Visit: Payer: Self-pay | Admitting: Surgery

## 2017-09-06 DIAGNOSIS — D241 Benign neoplasm of right breast: Secondary | ICD-10-CM

## 2017-09-06 NOTE — H&P (Signed)
Rhina Brackett Documented: 09/06/2017 10:27 AM Location: [facility] Patient #: [id number] DOB: 06-16-1980 Single / Language: [name] / Race: Black or African American Female  History of Present Illness Marcello Moores A. Calla Wedekind MD; 09/06/2017 10:54 AM) Patient words: Pt sent at the request of Dr Rosana Hoes for right breast mass. 37 year old female with a palpable right breast abnormality for about 2 months, with fluctuant changes in size and some associated tenderness. This has bee present since may. The area is in her right breast under the nipple. No discharge redness and the soreness is central made worse with manipulation.                           CLINICAL DATA: 37 year old female with a palpable right breast abnormality for about 2 months, with fluctuant changes in size and some associated tenderness.  EXAM: DIGITAL DIAGNOSTIC BILATERAL MAMMOGRAM WITH CAD AND TOMO  ULTRASOUND RIGHT BREAST  COMPARISON: Previous exam(s).  ACR Breast Density Category b: There are scattered areas of fibroglandular density.  FINDINGS: A radiopaque BB was placed at the site of the patient's palpable abnormality in the lateral periareolar right breast. A circumscribed, equal density mass is seen deep to the radiopaque BB. An additional cluster of circumscribed an irregular mass is noted in the central lateral breast at posterior depth. Further evaluation with ultrasound was performed.  No suspicious mammographic findings are identified on the left.  Mammographic images were processed with CAD.  On physical exam, I palpate a firm, mobile rubbery mass measuring 2-3 cm in the periareolar region.  Targeted ultrasound is performed, showing a lobulated, circumscribed hypoechoic mass at the 9 o'clock position 1 cm from the nipple. It demonstrates a few angular margins and marked vascularity. Overall measurements are 3.4 x 3.3 x 1.9 cm. This correlates with  the patient's palpable abnormality and mammographic finding.  Additional evaluation at the 8 o'clock position 7 cm from the nipple demonstrates an ill-defined, heterogenous mass. It demonstrates areas of central hypoechogenicity, vascularity and posterior acoustic shadowing. Overall measurements are 2.7 x 1.7 x 1.3 cm. This likely corresponds with the additional mammographic finding.  Evaluation of the right axilla demonstrates no significant adenopathy.  IMPRESSION: 1. Indeterminate right breast mass at the 9 o'clock periareolar region corresponding with the patient's palpable abnormality. Findings could represent a fibroadenoma, but given a few irregular and angular margins, ultrasound-guided biopsy is recommended. 2. Indeterminate right breast mass at the 8 o'clock position 7 cm from the nipple corresponding with the additional mammographic finding. Recommendation is for ultrasound-guided biopsy. 3. No suspicious right axillary lymphadenopathy. 4. No suspicious mammographic findings on the left.  RECOMMENDATION: Two area ultrasound-guided biopsy of the right breast is recommended.  I have discussed the findings and recommendations with the patient. Results were also provided in writing at the conclusion of the visit. If applicable, a reminder letter will be sent to the patient regarding the next appointment.  BI-RADS CATEGORY 4: Suspicious.   Electronically Signed By: Kristopher Oppenheim M.D. On: 07/30/2017 13:57             Diagnosis 1. Breast, right, needle core biopsy, 9 o'clock - BIPHASIC (EPITHELIAL/STROMAL) TUMOR. SEE NOTE. 2. Breast, right, needle core biopsy, 8 o'clock - FIBROADENOMA. - NEGATIVE FOR CARCINOMA. Diagnosis Note 1. The differential diagnosis for the biphasic tumor includes phyllodes tumor and fibroadenoma, though the former is favored. Dr. Lyndon Code has reviewed this case and concurs with the above interpretation. [facility] was notified on 08/16/2017 Jaquita Folds MD Pathologist, Electronic Signature.  The patient is a 37 year old female.   Past Surgical History Sharyn Lull R. Brooks, CMA; 09/06/2017 10:27 AM) Breast Biopsy Right.  Diagnostic Studies History Sharyn Lull R. Rolena Infante, CMA; 09/06/2017 10:27 AM) Colonoscopy never Mammogram within last year Pap Smear 1-5 years ago  Allergies Sharyn Lull R. Brooks, CMA; 09/06/2017 10:28 AM) No Known Drug Allergies [09/06/2017]:  Medication History Sharyn Lull R. Brooks, CMA; 09/06/2017 10:28 AM) Gabapentin (Oral prn) Specific strength unknown - Active. Medications Reconciled  Social History Sharyn Lull R. Brooks, CMA; 09/06/2017 10:27 AM) Alcohol use Moderate alcohol use. Caffeine use Carbonated beverages, Coffee, Tea. No drug use Tobacco use Former smoker.  Family History Sharyn Lull R. Rolena Infante, CMA; 09/06/2017 10:27 AM) Family history unknown First Degree Relatives  Pregnancy / Birth History Sharyn Lull R. Rolena Infante, CMA; 09/06/2017 10:27 AM) Age at menarche 87 years. Contraceptive History Intrauterine device. Gravida 2 Irregular periods Length (months) of breastfeeding 12-24 Maternal age 66-20 Para 1  Other Problems Sharyn Lull R. Brooks, CMA; 09/06/2017 10:27 AM) Arthritis Back Pain     Review of Systems [facility] R. Brooks CMA; 09/06/2017 10:27 AM) General Present- Weight Gain. Not Present- Appetite Loss, Chills, Fatigue, Fever, Night Sweats and Weight Loss. Skin Not Present- Change in Wart/Mole, Dryness, Hives, Jaundice, New Lesions, Non-Healing Wounds, Rash and Ulcer. HEENT Present- Seasonal Allergies. Not Present- Earache, Hearing Loss, Hoarseness, Nose Bleed, Oral Ulcers, Ringing in the Ears, Sinus Pain, Sore Throat, Visual Disturbances, Wears glasses/contact lenses and Yellow Eyes. Respiratory Not Present- Bloody sputum, Chronic Cough, Difficulty Breathing, Snoring and Wheezing. Breast Present- Breast Mass and  Breast Pain. Not Present- Nipple Discharge and Skin Changes. Cardiovascular Not Present- Chest Pain, Difficulty Breathing Lying Down, Leg Cramps, Palpitations, Rapid Heart Rate, Shortness of Breath and Swelling of Extremities. Gastrointestinal Not Present- Abdominal Pain, Bloating, Bloody Stool, Change in Bowel Habits, Chronic diarrhea, Constipation, Difficulty Swallowing, Excessive gas, Gets full quickly at meals, Hemorrhoids, Indigestion, Nausea, Rectal Pain and Vomiting. Female Genitourinary Not Present- Frequency, Nocturia, Painful Urination, Pelvic Pain and Urgency. Musculoskeletal Present- Back Pain. Not Present- Joint Pain, Joint Stiffness, Muscle Pain, Muscle Weakness and Swelling of Extremities. Neurological Not Present- Decreased Memory, Fainting, Headaches, Numbness, Seizures, Tingling, Tremor, Trouble walking and Weakness. Psychiatric Present- Depression. Not Present- Anxiety, Bipolar, Change in Sleep Pattern, Fearful and Frequent crying. Endocrine Not Present- Cold Intolerance, Excessive Hunger, Hair Changes, Heat Intolerance, Hot flashes and New Diabetes. Hematology Not Present- Blood Thinners, Easy Bruising, Excessive bleeding, Gland problems, HIV and Persistent Infections.  Vitals [employer] R. Brooks CMA; 09/06/2017 10:27 AM) 09/06/2017 10:27 AM Weight: 246.38 lb Height: 62in Body Surface Area: 2.09 m Body Mass Index: 45.06 kg/m  BP: 122/84 (Sitting, Left Arm, Standard)      Physical Exam (Providencia Hottenstein A. Eben Choinski MD; 09/06/2017 10:55 AM)  General Mental Status-Alert. General Appearance-Consistent with stated age. Hydration-Well hydrated. Voice-Normal.  Head and Neck Head-normocephalic, atraumatic with no lesions or palpable masses. Trachea-midline. Thyroid Gland Characteristics - normal size and consistency.  Eye Eyeball - Bilateral-Extraocular movements intact. Sclera/Conjunctiva - Bilateral-No scleral icterus.  Chest and Lung Exam Chest and  lung exam reveals -quiet, even and easy respiratory effort with no use of accessory muscles and on auscultation, normal breath sounds, no adventitious sounds and normal vocal resonance. Inspection Chest Wall - Normal. Back - normal.  Breast Note: right breast mass just below nipple 2 cm mobile left breast normal no nipple discharge  Cardiovascular Cardiovascular examination reveals -normal heart sounds, regular rate and rhythm with no  murmurs and normal pedal pulses bilaterally.  Neurologic Neurologic evaluation reveals -alert and oriented x 3 with no impairment of recent or remote memory. Mental Status-Normal.  Lymphatic Head & Neck  General Head & Neck Lymphatics: Bilateral - Description - Normal. Axillary  General Axillary Region: Bilateral - Description - Normal. Tenderness - Non Tender.    Assessment & Plan (Neziah Vogelgesang A. 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 Pt agrees to proceed. Both sites will be targeted with seeds.  Current Plans You are being scheduled for surgery- Our schedulers will call you.  You should hear from our office's scheduling department within 5 working days about the location, date, and time of surgery. We try to make accommodations for patient's preferences in scheduling surgery, but sometimes the OR schedule or the surgeon's schedule prevents [place] from making those accommodations.  If you have not heard from our office [phone]) in 5 working days, call the office and ask for your surgeon's nurse.  If you have other questions about your diagnosis, plan, or surgery, call the office and ask for your surgeon's nurse.  Pt Education - [facility] Breast Biopsy HCI:  discussed with patient and provided information. Pt Education - [facility] Breast Pains Education  BREAST FIBROADENOMA, RIGHT (D24.1)

## 2017-09-10 ENCOUNTER — Other Ambulatory Visit: Payer: Self-pay | Admitting: Surgery

## 2017-09-10 DIAGNOSIS — D241 Benign neoplasm of right breast: Secondary | ICD-10-CM

## 2017-09-14 ENCOUNTER — Telehealth: Payer: Self-pay | Admitting: Women's Health

## 2017-09-14 NOTE — Telephone Encounter (Signed)
Phone call to review breast biopsy probable fibroadenoma versus phyllodes tumor is scheduled for lumpectomy 10/07/2017 has already had preop visit with Dr. Brantley Stage.  Questions answered.

## 2017-09-29 ENCOUNTER — Other Ambulatory Visit: Payer: Self-pay

## 2017-09-29 ENCOUNTER — Encounter (HOSPITAL_BASED_OUTPATIENT_CLINIC_OR_DEPARTMENT_OTHER): Payer: Self-pay | Admitting: *Deleted

## 2017-10-05 ENCOUNTER — Encounter (HOSPITAL_BASED_OUTPATIENT_CLINIC_OR_DEPARTMENT_OTHER)
Admission: RE | Admit: 2017-10-05 | Discharge: 2017-10-05 | Disposition: A | Discharge status: Home / Self Care | Payer: 59 | Source: Ambulatory Visit | Attending: Surgery | Admitting: Surgery

## 2017-10-05 DIAGNOSIS — N6310 Unspecified lump in the right breast, unspecified quadrant: Secondary | ICD-10-CM | POA: Diagnosis present

## 2017-10-05 DIAGNOSIS — D241 Benign neoplasm of right breast: Secondary | ICD-10-CM | POA: Diagnosis not present

## 2017-10-05 DIAGNOSIS — Z87891 Personal history of nicotine dependence: Secondary | ICD-10-CM | POA: Diagnosis not present

## 2017-10-05 DIAGNOSIS — D4861 Neoplasm of uncertain behavior of right breast: Secondary | ICD-10-CM | POA: Diagnosis not present

## 2017-10-05 LAB — CBC WITH DIFFERENTIAL/PLATELET
Abs Immature Granulocytes: 0 10*3/uL (ref 0.0–0.1)
Basophils Absolute: 0 10*3/uL (ref 0.0–0.1)
Basophils Relative: 0 %
EOS ABS: 0 10*3/uL (ref 0.0–0.7)
EOS PCT: 1 %
HEMATOCRIT: 38.8 % (ref 36.0–46.0)
HEMOGLOBIN: 12.5 g/dL (ref 12.0–15.0)
Immature Granulocytes: 0 %
LYMPHS ABS: 1.3 10*3/uL (ref 0.7–4.0)
LYMPHS PCT: 30 %
MCH: 30 pg (ref 26.0–34.0)
MCHC: 32.2 g/dL (ref 30.0–36.0)
MCV: 93.3 fL (ref 78.0–100.0)
MONO ABS: 0.4 10*3/uL (ref 0.1–1.0)
Monocytes Relative: 9 %
Neutro Abs: 2.7 10*3/uL (ref 1.7–7.7)
Neutrophils Relative %: 60 %
Platelets: 264 10*3/uL (ref 150–400)
RBC: 4.16 MIL/uL (ref 3.87–5.11)
RDW: 12 % (ref 11.5–15.5)
WBC: 4.4 10*3/uL (ref 4.0–10.5)

## 2017-10-05 LAB — COMPREHENSIVE METABOLIC PANEL
ALT: 15 U/L (ref 0–44)
AST: 19 U/L (ref 15–41)
Albumin: 3.5 g/dL (ref 3.5–5.0)
Alkaline Phosphatase: 62 U/L (ref 38–126)
Anion gap: 5 (ref 5–15)
BILIRUBIN TOTAL: 0.6 mg/dL (ref 0.3–1.2)
BUN: 6 mg/dL (ref 6–20)
CALCIUM: 8.7 mg/dL — AB (ref 8.9–10.3)
CO2: 27 mmol/L (ref 22–32)
Chloride: 107 mmol/L (ref 98–111)
Creatinine, Ser: 1.09 mg/dL — ABNORMAL HIGH (ref 0.44–1.00)
GLUCOSE: 105 mg/dL — AB (ref 70–99)
POTASSIUM: 4.1 mmol/L (ref 3.5–5.1)
Sodium: 139 mmol/L (ref 135–145)
TOTAL PROTEIN: 6.6 g/dL (ref 6.5–8.1)

## 2017-10-05 NOTE — Progress Notes (Signed)
Ensure pre surgery drink given with instructions to complete by 0545 dos, pt verbalized understanding. 

## 2017-10-06 ENCOUNTER — Ambulatory Visit
Admission: RE | Admit: 2017-10-06 | Discharge: 2017-10-06 | Disposition: A | Discharge status: Home / Self Care | Payer: 59 | Source: Ambulatory Visit | Attending: Surgery | Admitting: Surgery

## 2017-10-06 DIAGNOSIS — D241 Benign neoplasm of right breast: Secondary | ICD-10-CM

## 2017-10-07 ENCOUNTER — Ambulatory Visit
Admission: RE | Admit: 2017-10-07 | Discharge: 2017-10-07 | Disposition: A | Discharge status: Home / Self Care | Payer: 59 | Source: Ambulatory Visit | Attending: Surgery | Admitting: Surgery

## 2017-10-07 ENCOUNTER — Encounter (HOSPITAL_BASED_OUTPATIENT_CLINIC_OR_DEPARTMENT_OTHER)
Admission: RE | Disposition: A | Discharge status: Home / Self Care | Payer: Self-pay | Source: Ambulatory Visit | Attending: Surgery

## 2017-10-07 ENCOUNTER — Encounter (HOSPITAL_BASED_OUTPATIENT_CLINIC_OR_DEPARTMENT_OTHER): Payer: Self-pay | Admitting: Anesthesiology

## 2017-10-07 ENCOUNTER — Ambulatory Visit (HOSPITAL_BASED_OUTPATIENT_CLINIC_OR_DEPARTMENT_OTHER): Payer: 59 | Admitting: Anesthesiology

## 2017-10-07 ENCOUNTER — Other Ambulatory Visit: Payer: Self-pay

## 2017-10-07 ENCOUNTER — Ambulatory Visit (HOSPITAL_BASED_OUTPATIENT_CLINIC_OR_DEPARTMENT_OTHER)
Admission: RE | Admit: 2017-10-07 | Discharge: 2017-10-07 | Disposition: A | Discharge status: Home / Self Care | Payer: 59 | Source: Ambulatory Visit | Attending: Surgery | Admitting: Surgery

## 2017-10-07 DIAGNOSIS — D4861 Neoplasm of uncertain behavior of right breast: Secondary | ICD-10-CM | POA: Diagnosis not present

## 2017-10-07 DIAGNOSIS — D241 Benign neoplasm of right breast: Secondary | ICD-10-CM

## 2017-10-07 DIAGNOSIS — Z87891 Personal history of nicotine dependence: Secondary | ICD-10-CM | POA: Insufficient documentation

## 2017-10-07 HISTORY — PX: BREAST LUMPECTOMY WITH RADIOACTIVE SEED LOCALIZATION: SHX6424

## 2017-10-07 LAB — POCT PREGNANCY, URINE: PREG TEST UR: NEGATIVE

## 2017-10-07 SURGERY — BREAST LUMPECTOMY WITH RADIOACTIVE SEED LOCALIZATION
Anesthesia: General | Site: Breast | Laterality: Right

## 2017-10-07 MED ORDER — SCOPOLAMINE 1 MG/3DAYS TD PT72: 1.0000 | MEDICATED_PATCH | Freq: Once | TRANSDERMAL | Status: DC | PRN

## 2017-10-07 MED ORDER — FENTANYL CITRATE (PF) 100 MCG/2ML IJ SOLN
INTRAMUSCULAR | Status: AC
  Filled 2017-10-07: qty 2

## 2017-10-07 MED ORDER — ONDANSETRON HCL 4 MG/2ML IJ SOLN
INTRAMUSCULAR | Status: DC | PRN
  Administered 2017-10-07: 4 mg via INTRAVENOUS

## 2017-10-07 MED ORDER — MIDAZOLAM HCL 2 MG/2ML IJ SOLN
INTRAMUSCULAR | Status: AC
  Filled 2017-10-07: qty 2

## 2017-10-07 MED ORDER — ONDANSETRON HCL 4 MG/2ML IJ SOLN
INTRAMUSCULAR | Status: AC
  Filled 2017-10-07: qty 2

## 2017-10-07 MED ORDER — CEFAZOLIN SODIUM-DEXTROSE 2-4 GM/100ML-% IV SOLN
INTRAVENOUS | Status: AC
  Filled 2017-10-07: qty 100

## 2017-10-07 MED ORDER — ACETAMINOPHEN 500 MG PO TABS
ORAL_TABLET | ORAL | Status: AC
  Filled 2017-10-07: qty 2

## 2017-10-07 MED ORDER — OXYCODONE HCL 5 MG PO TABS: 5.0000 mg | ORAL_TABLET | Freq: Four times a day (QID) | ORAL | 0 refills | Status: DC | PRN

## 2017-10-07 MED ORDER — PROPOFOL 10 MG/ML IV BOLUS
INTRAVENOUS | Status: DC | PRN
  Administered 2017-10-07: 200 mg via INTRAVENOUS

## 2017-10-07 MED ORDER — DEXAMETHASONE SODIUM PHOSPHATE 4 MG/ML IJ SOLN
INTRAMUSCULAR | Status: DC | PRN
  Administered 2017-10-07: 10 mg via INTRAVENOUS

## 2017-10-07 MED ORDER — FENTANYL CITRATE (PF) 100 MCG/2ML IJ SOLN
25.0000 ug | INTRAMUSCULAR | Status: DC | PRN
  Administered 2017-10-07: 50 ug via INTRAVENOUS
  Administered 2017-10-07: 25 ug via INTRAVENOUS

## 2017-10-07 MED ORDER — ONDANSETRON HCL 4 MG/2ML IJ SOLN: 4.0000 mg | Freq: Once | INTRAMUSCULAR | Status: DC | PRN

## 2017-10-07 MED ORDER — CELECOXIB 200 MG PO CAPS
ORAL_CAPSULE | ORAL | Status: AC
  Filled 2017-10-07: qty 1

## 2017-10-07 MED ORDER — IBUPROFEN 800 MG PO TABS: 800.0000 mg | ORAL_TABLET | Freq: Three times a day (TID) | ORAL | 0 refills | Status: AC | PRN

## 2017-10-07 MED ORDER — DEXTROSE 5 % IV SOLN
3.0000 g | INTRAVENOUS | Status: AC
  Administered 2017-10-07: 2 g via INTRAVENOUS

## 2017-10-07 MED ORDER — LIDOCAINE 2% (20 MG/ML) 5 ML SYRINGE
INTRAMUSCULAR | Status: DC | PRN
  Administered 2017-10-07: 40 mg via INTRAVENOUS

## 2017-10-07 MED ORDER — LACTATED RINGERS IV SOLN
INTRAVENOUS | Status: DC
  Administered 2017-10-07: 09:00:00 via INTRAVENOUS

## 2017-10-07 MED ORDER — ACETAMINOPHEN 500 MG PO TABS
1000.0000 mg | ORAL_TABLET | ORAL | Status: AC
  Administered 2017-10-07: 1000 mg via ORAL

## 2017-10-07 MED ORDER — CHLORHEXIDINE GLUCONATE CLOTH 2 % EX PADS: 6.0000 | MEDICATED_PAD | Freq: Once | CUTANEOUS | Status: DC

## 2017-10-07 MED ORDER — OXYCODONE HCL 5 MG PO TABS
5.0000 mg | ORAL_TABLET | Freq: Once | ORAL | Status: AC
  Administered 2017-10-07: 5 mg via ORAL

## 2017-10-07 MED ORDER — CELECOXIB 200 MG PO CAPS
200.0000 mg | ORAL_CAPSULE | ORAL | Status: AC
  Administered 2017-10-07: 200 mg via ORAL

## 2017-10-07 MED ORDER — FENTANYL CITRATE (PF) 100 MCG/2ML IJ SOLN
50.0000 ug | INTRAMUSCULAR | Status: DC | PRN
  Administered 2017-10-07: 25 ug via INTRAVENOUS
  Administered 2017-10-07: 100 ug via INTRAVENOUS

## 2017-10-07 MED ORDER — BUPIVACAINE-EPINEPHRINE (PF) 0.25% -1:200000 IJ SOLN
INTRAMUSCULAR | Status: DC | PRN
  Administered 2017-10-07: 30 mL via PERINEURAL

## 2017-10-07 MED ORDER — BUPIVACAINE-EPINEPHRINE (PF) 0.25% -1:200000 IJ SOLN
INTRAMUSCULAR | Status: AC
  Filled 2017-10-07: qty 30

## 2017-10-07 MED ORDER — OXYCODONE HCL 5 MG PO TABS
ORAL_TABLET | ORAL | Status: AC
  Filled 2017-10-07: qty 1

## 2017-10-07 MED ORDER — LIDOCAINE 2% (20 MG/ML) 5 ML SYRINGE
INTRAMUSCULAR | Status: AC
  Filled 2017-10-07: qty 5

## 2017-10-07 MED ORDER — DEXAMETHASONE SODIUM PHOSPHATE 10 MG/ML IJ SOLN
INTRAMUSCULAR | Status: AC
  Filled 2017-10-07: qty 1

## 2017-10-07 MED ORDER — MIDAZOLAM HCL 2 MG/2ML IJ SOLN
1.0000 mg | INTRAMUSCULAR | Status: DC | PRN
  Administered 2017-10-07: 2 mg via INTRAVENOUS

## 2017-10-07 MED ORDER — GABAPENTIN 300 MG PO CAPS
300.0000 mg | ORAL_CAPSULE | ORAL | Status: AC
  Administered 2017-10-07: 300 mg via ORAL

## 2017-10-07 MED ORDER — GABAPENTIN 300 MG PO CAPS
ORAL_CAPSULE | ORAL | Status: AC
  Filled 2017-10-07: qty 1

## 2017-10-07 SURGICAL SUPPLY — 52 items
APPLIER CLIP 9.375 MED OPEN (MISCELLANEOUS) ×3
BINDER BREAST 3XL (GAUZE/BANDAGES/DRESSINGS) ×3 IMPLANT
BINDER BREAST LRG (GAUZE/BANDAGES/DRESSINGS) IMPLANT
BINDER BREAST MEDIUM (GAUZE/BANDAGES/DRESSINGS) IMPLANT
BINDER BREAST XLRG (GAUZE/BANDAGES/DRESSINGS) IMPLANT
BINDER BREAST XXLRG (GAUZE/BANDAGES/DRESSINGS) ×3 IMPLANT
BLADE SURG 15 STRL LF DISP TIS (BLADE) ×1 IMPLANT
BLADE SURG 15 STRL SS (BLADE) ×2
CANISTER SUC SOCK COL 7IN (MISCELLANEOUS) IMPLANT
CANISTER SUCT 1200ML W/VALVE (MISCELLANEOUS) ×3 IMPLANT
CHLORAPREP W/TINT 26ML (MISCELLANEOUS) ×3 IMPLANT
CLIP APPLIE 9.375 MED OPEN (MISCELLANEOUS) ×1 IMPLANT
COVER BACK TABLE 60X90IN (DRAPES) ×3 IMPLANT
COVER MAYO STAND STRL (DRAPES) ×3 IMPLANT
COVER PROBE W GEL 5X96 (DRAPES) ×3 IMPLANT
DECANTER SPIKE VIAL GLASS SM (MISCELLANEOUS) IMPLANT
DERMABOND ADVANCED (GAUZE/BANDAGES/DRESSINGS) ×2
DERMABOND ADVANCED .7 DNX12 (GAUZE/BANDAGES/DRESSINGS) ×1 IMPLANT
DEVICE DUBIN W/COMP PLATE 8390 (MISCELLANEOUS) ×6 IMPLANT
DRAPE LAPAROSCOPIC ABDOMINAL (DRAPES) IMPLANT
DRAPE LAPAROTOMY 100X72 PEDS (DRAPES) ×3 IMPLANT
DRAPE UTILITY XL STRL (DRAPES) ×3 IMPLANT
ELECT COATED BLADE 2.86 ST (ELECTRODE) ×3 IMPLANT
ELECT REM PT RETURN 9FT ADLT (ELECTROSURGICAL) ×3
ELECTRODE REM PT RTRN 9FT ADLT (ELECTROSURGICAL) ×1 IMPLANT
GLOVE BIOGEL PI IND STRL 6.5 (GLOVE) ×1 IMPLANT
GLOVE BIOGEL PI IND STRL 8 (GLOVE) ×1 IMPLANT
GLOVE BIOGEL PI INDICATOR 6.5 (GLOVE) ×2
GLOVE BIOGEL PI INDICATOR 8 (GLOVE) ×2
GLOVE ECLIPSE 8.0 STRL XLNG CF (GLOVE) ×3 IMPLANT
GLOVE EXAM NITRILE MD LF STRL (GLOVE) ×3 IMPLANT
GLOVE SURG SS PI 6.5 STRL IVOR (GLOVE) ×3 IMPLANT
GOWN STRL REUS W/ TWL LRG LVL3 (GOWN DISPOSABLE) ×2 IMPLANT
GOWN STRL REUS W/TWL LRG LVL3 (GOWN DISPOSABLE) ×4
HEMOSTAT ARISTA ABSORB 3G PWDR (MISCELLANEOUS) IMPLANT
HEMOSTAT SNOW SURGICEL 2X4 (HEMOSTASIS) IMPLANT
KIT MARKER MARGIN INK (KITS) ×3 IMPLANT
NEEDLE HYPO 25X1 1.5 SAFETY (NEEDLE) ×3 IMPLANT
NS IRRIG 1000ML POUR BTL (IV SOLUTION) ×3 IMPLANT
PACK BASIN DAY SURGERY FS (CUSTOM PROCEDURE TRAY) ×3 IMPLANT
PENCIL BUTTON HOLSTER BLD 10FT (ELECTRODE) ×3 IMPLANT
SLEEVE SCD COMPRESS KNEE MED (MISCELLANEOUS) ×3 IMPLANT
SPONGE LAP 4X18 RFD (DISPOSABLE) ×3 IMPLANT
SUT MNCRL AB 4-0 PS2 18 (SUTURE) ×3 IMPLANT
SUT SILK 2 0 SH (SUTURE) IMPLANT
SUT VICRYL 3-0 CR8 SH (SUTURE) ×3 IMPLANT
SYR CONTROL 10ML LL (SYRINGE) ×3 IMPLANT
TOWEL GREEN STERILE FF (TOWEL DISPOSABLE) ×3 IMPLANT
TOWEL OR NON WOVEN STRL DISP B (DISPOSABLE) IMPLANT
TUBE CONNECTING 20'X1/4 (TUBING) ×1
TUBE CONNECTING 20X1/4 (TUBING) ×2 IMPLANT
YANKAUER SUCT BULB TIP NO VENT (SUCTIONS) ×3 IMPLANT

## 2017-10-07 NOTE — H&P (Signed)
Gabrielle Caldwell  Location: [facility] Patient #: [id number] DOB: 24-Feb-1980 Single / Language: [name] / Race: Black or African American Female  History of Present Illness ( Patient words: Pt sent at the request of Dr Rosana Hoes for right breast mass. 37 year old female with a palpable right breast abnormality for about 2 months, with fluctuant changes in size and some associated tenderness. This has bee present since may. The area is in her right breast under the nipple. No discharge redness and the soreness is central made worse with manipulation.                           CLINICAL DATA: 37 year old female with a palpable right breast abnormality for about 2 months, with fluctuant changes in size and some associated tenderness.  EXAM: DIGITAL DIAGNOSTIC BILATERAL MAMMOGRAM WITH CAD AND TOMO  ULTRASOUND RIGHT BREAST  COMPARISON: Previous exam(s).  ACR Breast Density Category b: There are scattered areas of fibroglandular density.  FINDINGS: A radiopaque BB was placed at the site of the patient's palpable abnormality in the lateral periareolar right breast. A circumscribed, equal density mass is seen deep to the radiopaque BB. An additional cluster of circumscribed an irregular mass is noted in the central lateral breast at posterior depth. Further evaluation with ultrasound was performed.  No suspicious mammographic findings are identified on the left.  Mammographic images were processed with CAD.  On physical exam, I palpate a firm, mobile rubbery mass measuring 2-3 cm in the periareolar region.  Targeted ultrasound is performed, showing a lobulated, circumscribed hypoechoic mass at the 9 o'clock position 1 cm from the nipple. It demonstrates a few angular margins and marked vascularity. Overall measurements are 3.4 x 3.3 x 1.9 cm. This correlates with the patient's palpable abnormality and  mammographic finding.  Additional evaluation at the 8 o'clock position 7 cm from the nipple demonstrates an ill-defined, heterogenous mass. It demonstrates areas of central hypoechogenicity, vascularity and posterior acoustic shadowing. Overall measurements are 2.7 x 1.7 x 1.3 cm. This likely corresponds with the additional mammographic finding.  Evaluation of the right axilla demonstrates no significant adenopathy.  IMPRESSION: 1. Indeterminate right breast mass at the 9 o'clock periareolar region corresponding with the patient's palpable abnormality. Findings could represent a fibroadenoma, but given a few irregular and angular margins, ultrasound-guided biopsy is recommended. 2. Indeterminate right breast mass at the 8 o'clock position 7 cm from the nipple corresponding with the additional mammographic finding. Recommendation is for ultrasound-guided biopsy. 3. No suspicious right axillary lymphadenopathy. 4. No suspicious mammographic findings on the left.  RECOMMENDATION: Two area ultrasound-guided biopsy of the right breast is recommended.  I have discussed the findings and recommendations with the patient. Results were also provided in writing at the conclusion of the visit. If applicable, a reminder letter will be sent to the patient regarding the next appointment.  BI-RADS CATEGORY 4: Suspicious.   Electronically Signed By: Kristopher Oppenheim M.D. On: 07/30/2017 13:57             Diagnosis 1. Breast, right, needle core biopsy, 9 o'clock - BIPHASIC (EPITHELIAL/STROMAL) TUMOR. SEE NOTE. 2. Breast, right, needle core biopsy, 8 o'clock - FIBROADENOMA. - NEGATIVE FOR CARCINOMA. Diagnosis Note 1. The differential diagnosis for the biphasic tumor includes phyllodes tumor and fibroadenoma, though the former is favored. Dr. Lyndon Code has reviewed this case and concurs with the above interpretation. The [place] was  notified on 08/16/2017 Jaquita Folds MD  Pathologist, Brewing technologist.  The patient is a 37 year old female.   Past Surgical History Sharyn Lull R. Brooks, CMA; 09/06/2017 10:27 AM) Breast Biopsy Right.  Diagnostic Studies History Sharyn Lull R. Rolena Infante, CMA; 09/06/2017 10:27 AM) Colonoscopy never Mammogram within last year Pap Smear 1-5 years ago  Allergies Sharyn Lull R. Rolena Infante, CMA; No Known Drug Allergies [09/06/2017]:  Medication History Sharyn Lull R. Brooks, CMA; Gabapentin (Oral prn) Specific strength unknown - Active. Medications Reconciled  Social History Sharyn Lull R. Rolena Infante, CMA;  Alcohol use Moderate alcohol use. Caffeine use Carbonated beverages, Coffee, Tea. No drug use Tobacco use Former smoker.  Family History Sharyn Lull R. Rolena Infante, CMA; Family history unknown First Degree Relatives  Pregnancy / Birth History Sharyn Lull R. Rolena Infante, CMA;  Age at menarche 68 years. Contraceptive History Intrauterine device. Gravida 2 Irregular periods Length (months) of breastfeeding 12-24 Maternal age 34-20 Para 1  Other Problems Sharyn Lull R. Rolena Infante, CMA Arthritis Back Pain     Review of Systems (Weinert. Brooks CMA; General Present- Weight Gain. Not Present- Appetite Loss, Chills, Fatigue, Fever, Night Sweats and Weight Loss. Skin Not Present- Change in Wart/Mole, Dryness, Hives, Jaundice, New Lesions, Non-Healing Wounds, Rash and Ulcer. HEENT Present- Seasonal Allergies. Not Present- Earache, Hearing Loss, Hoarseness, Nose Bleed, Oral Ulcers, Ringing in the Ears, Sinus Pain, Sore Throat, Visual Disturbances, Wears glasses/contact lenses and Yellow Eyes. Respiratory Not Present- Bloody sputum, Chronic Cough, Difficulty Breathing, Snoring and Wheezing. Breast Present- Breast Mass and Breast Pain. Not Present- Nipple Discharge and Skin Changes. Cardiovascular Not Present- Chest Pain, Difficulty Breathing Lying Down, Leg Cramps,  Palpitations, Rapid Heart Rate, Shortness of Breath and Swelling of Extremities. Gastrointestinal Not Present- Abdominal Pain, Bloating, Bloody Stool, Change in Bowel Habits, Chronic diarrhea, Constipation, Difficulty Swallowing, Excessive gas, Gets full quickly at meals, Hemorrhoids, Indigestion, Nausea, Rectal Pain and Vomiting. Female Genitourinary Not Present- Frequency, Nocturia, Painful Urination, Pelvic Pain and Urgency. Musculoskeletal Present- Back Pain. Not Present- Joint Pain, Joint Stiffness, Muscle Pain, Muscle Weakness and Swelling of Extremities. Neurological Not Present- Decreased Memory, Fainting, Headaches, Numbness, Seizures, Tingling, Tremor, Trouble walking and Weakness. Psychiatric Present- Depression. Not Present- Anxiety, Bipolar, Change in Sleep Pattern, Fearful and Frequent crying. Endocrine Not Present- Cold Intolerance, Excessive Hunger, Hair Changes, Heat Intolerance, Hot flashes and New Diabetes. Hematology Not Present- Blood Thinners, Easy Bruising, Excessive bleeding, Gland problems, HIV and Persistent Infections.  Vitals [employer] R. Brooks CMA0:27 AM Weight: 246.38 lb Height: 62in Body Surface Area: 2.09 m Body Mass Index: 45.06 kg/m  BP: 122/84 (Sitting, Left Arm, Standard)      Physical Exam (Jordyan Hardiman A. Maleke Feria MD; 10:55 AM)  General Mental Status-Alert. General Appearance-Consistent with stated age. Hydration-Well hydrated. Voice-Normal.  Head and Neck Head-normocephalic, atraumatic with no lesions or palpable masses. Trachea-midline. Thyroid Gland Characteristics - normal size and consistency.  Eye Eyeball - Bilateral-Extraocular movements intact. Sclera/Conjunctiva - Bilateral-No scleral icterus.  Chest and Lung Exam Chest and lung exam reveals -quiet, even and easy respiratory effort with no use of accessory muscles and on auscultation, normal breath sounds, no adventitious sounds and normal vocal  resonance. Inspection Chest Wall - Normal. Back - normal.  Breast Note: right breast mass just below nipple 2 cm mobile left breast normal no nipple discharge  Cardiovascular Cardiovascular examination reveals -normal heart sounds, regular rate and rhythm with no murmurs and normal pedal pulses bilaterally.  Neurologic Neurologic evaluation reveals -alert and oriented x 3 with no impairment of recent or remote memory. Mental Status-Normal.  Lymphatic Head & Neck  General Head &  Neck Lymphatics: Bilateral - Description - Normal. Axillary  General Axillary Region: Bilateral - Description - Normal. Tenderness - Non Tender.    Assessment & Plan (Kaysia Willard A. 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 Pt agrees to proceed. Both sites will be targeted with seeds.  Current Plans You are being scheduled for surgery- Our schedulers will call you.  You should hear from our office's scheduling department within 5 working days about the location, date, and time of surgery. We try to make accommodations for patient's preferences in scheduling surgery, but sometimes the OR schedule or the surgeon's schedule prevents [place] from making those accommodations.  If you have not heard from our office [phone]) in 5 working days, call the office and ask for your surgeon's nurse.  If you have other questions about your diagnosis, plan, or surgery, call the office and ask for your surgeon's nurse.  Pt Education - [facility] Breast Biopsy HCI: discussed with patient and provided information. Pt Education - [facility] Breast Pains Education  BREAST FIBROADENOMA, RIGHT (D24.1)

## 2017-10-07 NOTE — Transfer of Care (Signed)
Immediate Anesthesia Transfer of Care Note  Patient: Gabrielle Caldwell  Procedure(s) Performed: RIGHT BREAST LUMPECTOMY WITH RADIOACTIVE SEED LOCALIZATION X'S 2 (Right Breast)  Patient Location: PACU  Anesthesia Type:General  Level of Consciousness: awake and sedated  Airway & Oxygen Therapy: Patient Spontanous Breathing and Patient connected to face mask oxygen  Post-op Assessment: Report given to RN and Post -op Vital signs reviewed and stable  Post vital signs: Reviewed and stable  Last Vitals:  Vitals Value Taken Time  BP    Temp    Pulse    Resp    SpO2      Last Pain:  Vitals:   10/07/17 0909  TempSrc: Oral  PainSc: 0-No pain      Patients Stated Pain Goal: 0 (25/08/71 9941)  Complications: No apparent anesthesia complications

## 2017-10-07 NOTE — Anesthesia Postprocedure Evaluation (Signed)
Anesthesia Post Note  Patient: Gabrielle Caldwell  Procedure(s) Performed: RIGHT BREAST LUMPECTOMY WITH RADIOACTIVE SEED LOCALIZATION X'S 2 (Right Breast)     Patient location during evaluation: PACU Anesthesia Type: General Level of consciousness: awake and alert Pain management: pain level controlled Vital Signs Assessment: post-procedure vital signs reviewed and stable Respiratory status: spontaneous breathing, nonlabored ventilation, respiratory function stable and patient connected to nasal cannula oxygen Cardiovascular status: blood pressure returned to baseline and stable Postop Assessment: no apparent nausea or vomiting Anesthetic complications: no    Last Vitals:  Vitals:   10/07/17 1130 10/07/17 1159  BP:  110/73  Pulse:  64  Resp:  18  Temp:  36.6 C  SpO2: 97% 100%    Last Pain:  Vitals:   10/07/17 1159  TempSrc:   PainSc: 2                  Tiajuana Amass

## 2017-10-07 NOTE — Anesthesia Postprocedure Evaluation (Signed)
Anesthesia Post Note  Patient: Gabrielle Caldwell  Procedure(s) Performed: RIGHT BREAST LUMPECTOMY WITH RADIOACTIVE SEED LOCALIZATION X'S 2 (Right Breast)     Patient location during evaluation: PACU Anesthesia Type: General Level of consciousness: awake and alert and oriented Pain management: pain level controlled Vital Signs Assessment: post-procedure vital signs reviewed and stable Respiratory status: spontaneous breathing, nonlabored ventilation and respiratory function stable Cardiovascular status: blood pressure returned to baseline and stable Postop Assessment: no apparent nausea or vomiting Anesthetic complications: no    Last Vitals:  Vitals:   10/07/17 1115 10/07/17 1130  BP: 114/73   Pulse: 68   Resp: 13   Temp:    SpO2: 97% 97%    Last Pain:  Vitals:   10/07/17 1130  TempSrc:   PainSc: 3                  Liane Tribbey A.

## 2017-10-07 NOTE — Anesthesia Procedure Notes (Signed)
Procedure Name: LMA Insertion Performed by: Vannesa Abair W, CRNA Pre-anesthesia Checklist: Patient identified, Emergency Drugs available, Suction available and Patient being monitored Patient Re-evaluated:Patient Re-evaluated prior to induction Oxygen Delivery Method: Circle system utilized Preoxygenation: Pre-oxygenation with 100% oxygen Induction Type: IV induction Ventilation: Mask ventilation without difficulty LMA: LMA inserted LMA Size: 4.0 Number of attempts: 1 Placement Confirmation: positive ETCO2 Tube secured with: Tape Dental Injury: Teeth and Oropharynx as per pre-operative assessment        

## 2017-10-07 NOTE — Interval H&P Note (Signed)
History and Physical Interval Note:  10/07/2017 8:49 AM  Gabrielle Caldwell  has presented today for surgery, with the diagnosis of RIGHT BREAST FIBROADENOMA X'S 2  The various methods of treatment have been discussed with the patient and family. After consideration of risks, benefits and other options for treatment, the patient has consented to  Procedure(s): BREAST LUMPECTOMY WITH RADIOACTIVE SEED LOCALIZATION X'S 2 (Right) as a surgical intervention .  The patient's history has been reviewed, patient examined, no change in status, stable for surgery.  I have reviewed the patient's chart and labs.  Questions were answered to the patient's satisfaction.     [place]

## 2017-10-07 NOTE — Op Note (Signed)
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  Both were diagnosed by mammogram, physical examination and core biopsy.  Both a clips placed in them.  The patient desired excision of both the same time since they have a propensity to grow especially at her young age.  Also, need to exclude a phyllodes tumor.The procedure has been discussed with the patient. Alternatives to surgery have been discussed with the patient.  Risks of surgery include bleeding,  Infection,  Seroma formation, death,  and the need for further surgery.   The patient understands and wishes to proceed.    Description of procedure: The patient was met in the holding area.  Neoprobe used to identify both seeds in the right breast.  Questions were answered.  She was taken back to the operating room.  She was placed supine upon the operating room table.  After induction of general anesthesia, the right breast was prepped and draped in sterile fashion, timeout was performed and she received appropriate antibiotics.  Her films were available for review.  Neoprobe was used.  The first lesion was more superficial at around 7-8 o'clock in the right breast at the edge of the nipple areolar complex.  The neoprobe was used to identify the seed in this mass which was palpable.  The second mass was deeper at roughly 6-7 o'clock in the right breast.  I used the neoprobe mark the skin over that.  Curvilinear incision was made along the lateral border of the nipple areolar complex.   Local anesthetic was infiltrated consisting of 0.25% Sensorcaine local with epinephrine.  The mass was palpable was excised with grossly negative margins.  Faxitron revealed both the seed and clip to be in the specimen.  This was passed off the field after inking all of the margins and sent to pathology.  The neoprobe was used.  The second lesion was identified.  Through the same incision is able to dissect down to identify it and excise it with grossly negative margins.  The margins are inked and Faxitron image revealed both the seed and clip to be present.  This was sent to pathology and both seeds were received.  Hemostasis achieved with cautery and local anesthetic was infiltrated into the lumpectomy cavity.  Irrigation was used and suctioned out until clear.  Wound closed with a deep layer of 3-0 Vicryl and a 4-0 Monocryl subcuticular stitch.  Dermabond applied.  Breast binder placed.  All final counts were found to be correct.  The patient was awoke extubated taken to recovery in satisfactory condition.

## 2017-10-07 NOTE — Anesthesia Preprocedure Evaluation (Signed)
Anesthesia Evaluation  Patient identified by MRN, date of birth, ID band Patient awake    Reviewed: Allergy & Precautions, NPO status , Patient's Chart, lab work & pertinent test results  Airway Mallampati: II  TM Distance: >3 FB Neck ROM: Full    Dental  (+) Dental Advisory Given   Pulmonary former smoker,    breath sounds clear to auscultation       Cardiovascular negative cardio ROS   Rhythm:Regular Rate:Normal     Neuro/Psych negative neurological ROS     GI/Hepatic negative GI ROS, Neg liver ROS,   Endo/Other    Renal/GU negative Renal ROS     Musculoskeletal   Abdominal   Peds  Hematology negative hematology ROS (+)   Anesthesia Other Findings   Reproductive/Obstetrics                             Anesthesia Physical Anesthesia Plan  ASA: III  Anesthesia Plan: General   Post-op Pain Management:    Induction: Intravenous  PONV Risk Score and Plan: 3 and Ondansetron, Dexamethasone, Treatment may vary due to age or medical condition and Midazolam  Airway Management Planned: LMA  Additional Equipment:   Intra-op Plan:   Post-operative Plan: Extubation in OR  Informed Consent: I have reviewed the patients History and Physical, chart, labs and discussed the procedure including the risks, benefits and alternatives for the proposed anesthesia with the patient or authorized representative who has indicated his/her understanding and acceptance.   Dental advisory given  Plan Discussed with: CRNA  Anesthesia Plan Comments:         Anesthesia Quick Evaluation

## 2017-10-07 NOTE — Discharge Instructions (Signed)
Oxycodone given at 1200 next dose due at 6:00 pm today 10/07/17 Post Anesthesia Home Care Instructions   Activity: Get plenty of rest for the remainder of the day. A responsible individual must stay with you for 24 hours following the procedure.  For the next 24 hours, DO NOT: -Drive a car -Paediatric nurse -Drink alcoholic beverages -Take any medication unless instructed by your physician -Make any legal decisions or sign important papers.  Meals: Start with liquid foods such as gelatin or soup. Progress to regular foods as tolerated. Avoid greasy, spicy, heavy foods. If nausea and/or vomiting occur, drink only clear liquids until the nausea and/or vomiting subsides. Call your physician if vomiting continues.  Special Instructions/Symptoms: Your throat may feel dry or sore from the anesthesia or the breathing tube placed in your throat during surgery. If this causes discomfort, gargle with warm salt water. The discomfort should disappear within 24 hours.  If you had a scopolamine patch placed behind your ear for the management of post- operative nausea and/or vomiting:  1. The medication in the patch is effective for 72 hours, after which it should be removed.  Wrap patch in a tissue and discard in the trash. Wash hands thoroughly with soap and water. 2. You may remove the patch earlier than 72 hours if you experience unpleasant side effects which may include dry mouth, dizziness or visual disturbances. 3. Avoid touching the patch. Wash your hands with soap and water after contact with the patch.   [place] Office Phone Number [phone]  BREAST BIOPSY/ PARTIAL MASTECTOMY: POST OP INSTRUCTIONS  Always review your discharge instruction sheet given to you by the facility where your surgery was performed.  IF YOU HAVE DISABILITY OR FAMILY LEAVE FORMS, YOU MUST BRING THEM TO THE OFFICE FOR PROCESSING.  DO NOT GIVE THEM TO YOUR DOCTOR.  1. A prescription for pain  medication may be given to you upon discharge.  Take your pain medication as prescribed, if needed.  If narcotic pain medicine is not needed, then you may take acetaminophen (Tylenol) or ibuprofen (Advil) as needed. 2. Take your usually prescribed medications unless otherwise directed 3. If you need a refill on your pain medication, please contact your pharmacy.  They will contact our office to request authorization.  Prescriptions will not be filled after 5pm or on week-ends. 4. You should eat very light the first 24 hours after surgery, such as soup, crackers, pudding, etc.  Resume your normal diet the day after surgery. 5. Most patients will experience some swelling and bruising in the breast.  Ice packs and a good support bra will help.  Swelling and bruising can take several days to resolve.  6. It is common to experience some constipation if taking pain medication after surgery.  Increasing fluid intake and taking a stool softener will usually help or prevent this problem from occurring.  A mild laxative (Milk of Magnesia or Miralax) should be taken according to package directions if there are no bowel movements after 48 hours. 7. Unless discharge instructions indicate otherwise, you may remove your bandages 24-48 hours after surgery, and you may shower at that time.  You may have steri-strips (small skin tapes) in place directly over the incision.  These strips should be left on the skin for 7-10 days.  If your surgeon used skin glue on the incision, you may shower in 24 hours.  The glue will flake off over the next 2-3 weeks.  Any sutures or staples will  be removed at the office during your follow-up visit. 8. ACTIVITIES:  You may resume regular daily activities (gradually increasing) beginning the next day.  Wearing a good support bra or sports bra minimizes pain and swelling.  You may have sexual intercourse when it is comfortable. a. You may drive when you no longer are taking prescription pain  medication, you can comfortably wear a seatbelt, and you can safely maneuver your car and apply brakes. b. RETURN TO WORK:  ______________________________________________________________________________________ 9. You should see your doctor in the office for a follow-up appointment approximately two weeks after your surgery.  Your doctors nurse will typically make your follow-up appointment when she calls you with your pathology report.  Expect your pathology report 2-3 business days after your surgery.  You may call to check if you do not hear from [place] after three days. 10. OTHER INSTRUCTIONS: _______________________________________________________________________________________________ _____________________________________________________________________________________________________________________________________ _____________________________________________________________________________________________________________________________________ _____________________________________________________________________________________________________________________________________  WHEN TO CALL YOUR DOCTOR: 1. Fever over 101.0 2. Nausea and/or vomiting. 3. Extreme swelling or bruising. 4. Continued bleeding from incision. 5. Increased pain, redness, or drainage from the incision.  The clinic staff is available to answer your questions during regular business hours.  Please dont hesitate to call and ask to speak to one of the nurses for clinical concerns.  If you have a medical emergency, go to the nearest emergency room or call 911.  A surgeon from [facility] is always on call at the hospital.  For further questions, please visit [email or web address]

## 2017-10-08 ENCOUNTER — Encounter (HOSPITAL_BASED_OUTPATIENT_CLINIC_OR_DEPARTMENT_OTHER): Payer: Self-pay | Admitting: Surgery

## 2017-10-12 ENCOUNTER — Ambulatory Visit: Payer: Self-pay | Admitting: Surgery

## 2017-10-26 ENCOUNTER — Ambulatory Visit: Payer: Self-pay | Admitting: Surgery

## 2017-10-26 NOTE — H&P (Signed)
Gabrielle Caldwell Documented: 10/26/2017 9:15 AM Location: [facility] Patient #: [id number] DOB: 1980-07-09 Single / Language: [name] / Race: Black or African American Female  History of Present Illness (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 Anterior margin was involved therefore she will require reexcision. This is scheduled for November. She is doing well without complaint.  The patient is a 37 year old female.   Allergies Sabino Gasser; 10/26/2017 9:15 AM) No Known Drug Allergies [09/06/2017]: Allergies Reconciled  Medication History Sabino Gasser; 10/26/2017 9:15 AM) Gabapentin (Oral prn) Specific strength unknown - Active. Medications Reconciled    Vitals Sabino Gasser; 10/26/2017 9:16 AM) 10/26/2017 9:16 AM Weight: 243.25 lb Height: 62in Body Surface Area: 2.08 m Body Mass Index: 44.49 kg/m  Temp.: 98.56F(Oral)  Pulse: 91 (Regular)  BP: 130/82 (Sitting, Left Arm, Standard)      Physical Exam ([name] MD; 10/26/2017 9:26 AM)  General Mental Status-Alert. General Appearance-Consistent with stated age. Hydration-Well hydrated. Voice-Normal.  Head and Neck Head-normocephalic, atraumatic with no lesions or palpable masses. Trachea-midline. Thyroid Gland Characteristics - normal size and consistency.  Breast Note: Right breast incision clean dry and intact. No evidence of infection or seroma.  Neurologic Neurologic evaluation reveals -alert and oriented x 3 with no impairment of recent or remote memory. Mental Status-Normal.  Musculoskeletal Normal Exam - Left-Upper Extremity Strength Normal and Lower Extremity Strength Normal. Normal Exam - Right-Upper Extremity Strength Normal and Lower Extremity Strength Normal.    Assessment & Plan (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  May resume full activity at this point.  Current Plans Pt Education - [employer] Free Text Education/Instructions: discussed with patient and provided informatio

## 2017-11-26 DIAGNOSIS — D4860 Neoplasm of uncertain behavior of unspecified breast: Secondary | ICD-10-CM

## 2017-11-26 HISTORY — DX: Neoplasm of uncertain behavior of unspecified breast: D48.60

## 2017-12-02 ENCOUNTER — Other Ambulatory Visit: Payer: Self-pay | Admitting: Women's Health

## 2017-12-02 DIAGNOSIS — B009 Herpesviral infection, unspecified: Secondary | ICD-10-CM

## 2017-12-14 ENCOUNTER — Other Ambulatory Visit: Payer: Self-pay

## 2017-12-14 ENCOUNTER — Encounter (HOSPITAL_BASED_OUTPATIENT_CLINIC_OR_DEPARTMENT_OTHER): Payer: Self-pay | Admitting: *Deleted

## 2017-12-14 NOTE — Pre-Procedure Instructions (Signed)
To come pick up Ensure pre-surgery drink 10 oz. - to drink by 0415 DOS. 

## 2017-12-20 NOTE — Progress Notes (Signed)
Spoke to [place], she said she could drink the [employer].  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

## 2017-12-20 NOTE — Anesthesia Preprocedure Evaluation (Addendum)
Anesthesia Evaluation  Patient identified by MRN, date of birth, ID band Patient awake    Reviewed: Allergy & Precautions, NPO status , Patient's Chart, lab work & pertinent test results  Airway Mallampati: I       Dental no notable dental hx. (+) Dental Advisory Given, Teeth Intact   Pulmonary neg pulmonary ROS, former smoker,    Pulmonary exam normal breath sounds clear to auscultation       Cardiovascular negative cardio ROS Normal cardiovascular exam Rhythm:Regular Rate:Normal     Neuro/Psych negative neurological ROS  negative psych ROS   GI/Hepatic negative GI ROS, Neg liver ROS,   Endo/Other  Morbid obesity  Renal/GU negative Renal ROS  negative genitourinary   Musculoskeletal   Abdominal (+) + obese,   Peds  Hematology negative hematology ROS (+)   Anesthesia Other Findings   Reproductive/Obstetrics negative OB ROS                           Anesthesia Physical  Anesthesia Plan  ASA: III  Anesthesia Plan: General   Post-op Pain Management:    Induction: Intravenous  PONV Risk Score and Plan: 3 and Ondansetron, Dexamethasone, Treatment may vary due to age or medical condition and Midazolam  Airway Management Planned: LMA  Additional Equipment:   Intra-op Plan:   Post-operative Plan: Extubation in OR  Informed Consent: I have reviewed the patients History and Physical, chart, labs and discussed the procedure including the risks, benefits and alternatives for the proposed anesthesia with the patient or authorized representative who has indicated his/her understanding and acceptance.   Dental advisory given  Plan Discussed with: CRNA and Surgeon  Anesthesia Plan Comments:        Anesthesia Quick Evaluation

## 2017-12-20 NOTE — Progress Notes (Signed)
Ensure pre surgery drink given with instructions to complete by 0415 dos, pt verbalized understanding. 

## 2017-12-21 ENCOUNTER — Ambulatory Visit (HOSPITAL_BASED_OUTPATIENT_CLINIC_OR_DEPARTMENT_OTHER): Payer: 59 | Admitting: Anesthesiology

## 2017-12-21 ENCOUNTER — Encounter (HOSPITAL_BASED_OUTPATIENT_CLINIC_OR_DEPARTMENT_OTHER)
Admission: RE | Disposition: A | Discharge status: Home / Self Care | Payer: Self-pay | Source: Ambulatory Visit | Attending: Surgery

## 2017-12-21 ENCOUNTER — Encounter (HOSPITAL_BASED_OUTPATIENT_CLINIC_OR_DEPARTMENT_OTHER): Payer: Self-pay | Admitting: *Deleted

## 2017-12-21 ENCOUNTER — Ambulatory Visit (HOSPITAL_BASED_OUTPATIENT_CLINIC_OR_DEPARTMENT_OTHER)
Admission: RE | Admit: 2017-12-21 | Discharge: 2017-12-21 | Disposition: A | Discharge status: Home / Self Care | Payer: 59 | Source: Ambulatory Visit | Attending: Surgery | Admitting: Surgery

## 2017-12-21 ENCOUNTER — Other Ambulatory Visit: Payer: Self-pay

## 2017-12-21 DIAGNOSIS — Z87891 Personal history of nicotine dependence: Secondary | ICD-10-CM | POA: Diagnosis not present

## 2017-12-21 DIAGNOSIS — Z6841 Body Mass Index (BMI) 40.0 and over, adult: Secondary | ICD-10-CM | POA: Diagnosis not present

## 2017-12-21 DIAGNOSIS — D4861 Neoplasm of uncertain behavior of right breast: Secondary | ICD-10-CM | POA: Insufficient documentation

## 2017-12-21 HISTORY — PX: RE-EXCISION OF BREAST LUMPECTOMY: SHX6048

## 2017-12-21 HISTORY — DX: Unspecified osteoarthritis, unspecified site: M19.90

## 2017-12-21 HISTORY — DX: Neoplasm of uncertain behavior of unspecified breast: D48.60

## 2017-12-21 SURGERY — EXCISION, LESION, BREAST
Anesthesia: General | Site: Breast | Laterality: Right

## 2017-12-21 MED ORDER — LACTATED RINGERS IV SOLN
INTRAVENOUS | Status: DC
  Administered 2017-12-21: 09:00:00 via INTRAVENOUS

## 2017-12-21 MED ORDER — ONDANSETRON HCL 4 MG/2ML IJ SOLN: 4.0000 mg | Freq: Once | INTRAMUSCULAR | Status: DC | PRN

## 2017-12-21 MED ORDER — CHLORHEXIDINE GLUCONATE CLOTH 2 % EX PADS: 6.0000 | MEDICATED_PAD | Freq: Once | CUTANEOUS | Status: DC

## 2017-12-21 MED ORDER — KETOROLAC TROMETHAMINE 30 MG/ML IJ SOLN: 30.0000 mg | Freq: Once | INTRAMUSCULAR | Status: DC | PRN

## 2017-12-21 MED ORDER — GABAPENTIN 300 MG PO CAPS
ORAL_CAPSULE | ORAL | Status: AC
  Filled 2017-12-21: qty 1

## 2017-12-21 MED ORDER — FENTANYL CITRATE (PF) 100 MCG/2ML IJ SOLN
50.0000 ug | INTRAMUSCULAR | Status: DC | PRN
  Administered 2017-12-21: 100 ug via INTRAVENOUS

## 2017-12-21 MED ORDER — FENTANYL CITRATE (PF) 100 MCG/2ML IJ SOLN
INTRAMUSCULAR | Status: AC
  Filled 2017-12-21: qty 2

## 2017-12-21 MED ORDER — DEXTROSE 5 % IV SOLN
3.0000 g | INTRAVENOUS | Status: AC
  Administered 2017-12-21: 2 g via INTRAVENOUS

## 2017-12-21 MED ORDER — MEPERIDINE HCL 25 MG/ML IJ SOLN: 6.2500 mg | INTRAMUSCULAR | Status: DC | PRN

## 2017-12-21 MED ORDER — ACETAMINOPHEN 500 MG PO TABS
1000.0000 mg | ORAL_TABLET | ORAL | Status: AC
  Administered 2017-12-21: 1000 mg via ORAL

## 2017-12-21 MED ORDER — DEXAMETHASONE SODIUM PHOSPHATE 4 MG/ML IJ SOLN
INTRAMUSCULAR | Status: DC | PRN
  Administered 2017-12-21: 10 mg via INTRAVENOUS

## 2017-12-21 MED ORDER — CEFAZOLIN SODIUM-DEXTROSE 2-4 GM/100ML-% IV SOLN
INTRAVENOUS | Status: AC
  Filled 2017-12-21: qty 100

## 2017-12-21 MED ORDER — PROPOFOL 500 MG/50ML IV EMUL
INTRAVENOUS | Status: AC
  Filled 2017-12-21: qty 50

## 2017-12-21 MED ORDER — LIDOCAINE 2% (20 MG/ML) 5 ML SYRINGE
INTRAMUSCULAR | Status: DC | PRN
  Administered 2017-12-21: 100 mg via INTRAVENOUS

## 2017-12-21 MED ORDER — MIDAZOLAM HCL 2 MG/2ML IJ SOLN
INTRAMUSCULAR | Status: AC
  Filled 2017-12-21: qty 2

## 2017-12-21 MED ORDER — OXYCODONE HCL 5 MG PO TABS: 5.0000 mg | ORAL_TABLET | Freq: Four times a day (QID) | ORAL | 0 refills | Status: DC | PRN

## 2017-12-21 MED ORDER — ONDANSETRON HCL 4 MG/2ML IJ SOLN
INTRAMUSCULAR | Status: AC
  Filled 2017-12-21: qty 2

## 2017-12-21 MED ORDER — DEXAMETHASONE SODIUM PHOSPHATE 10 MG/ML IJ SOLN
INTRAMUSCULAR | Status: AC
  Filled 2017-12-21: qty 1

## 2017-12-21 MED ORDER — ACETAMINOPHEN 500 MG PO TABS
ORAL_TABLET | ORAL | Status: AC
  Filled 2017-12-21: qty 2

## 2017-12-21 MED ORDER — 0.9 % SODIUM CHLORIDE (POUR BTL) OPTIME
TOPICAL | Status: DC | PRN
  Administered 2017-12-21: 1000 mL

## 2017-12-21 MED ORDER — MIDAZOLAM HCL 2 MG/2ML IJ SOLN
1.0000 mg | INTRAMUSCULAR | Status: DC | PRN
  Administered 2017-12-21: 2 mg via INTRAVENOUS

## 2017-12-21 MED ORDER — BUPIVACAINE-EPINEPHRINE 0.25% -1:200000 IJ SOLN
INTRAMUSCULAR | Status: DC | PRN
  Administered 2017-12-21: 20 mL

## 2017-12-21 MED ORDER — FENTANYL CITRATE (PF) 100 MCG/2ML IJ SOLN
25.0000 ug | INTRAMUSCULAR | Status: DC | PRN
  Administered 2017-12-21: 25 ug via INTRAVENOUS
  Administered 2017-12-21: 50 ug via INTRAVENOUS
  Administered 2017-12-21: 25 ug via INTRAVENOUS

## 2017-12-21 MED ORDER — SCOPOLAMINE 1 MG/3DAYS TD PT72: 1.0000 | MEDICATED_PATCH | Freq: Once | TRANSDERMAL | Status: DC | PRN

## 2017-12-21 MED ORDER — OXYCODONE HCL 5 MG PO TABS
5.0000 mg | ORAL_TABLET | Freq: Once | ORAL | Status: AC | PRN
  Administered 2017-12-21: 5 mg via ORAL

## 2017-12-21 MED ORDER — OXYCODONE HCL 5 MG/5ML PO SOLN: 5.0000 mg | Freq: Once | ORAL | Status: AC | PRN

## 2017-12-21 MED ORDER — CELECOXIB 200 MG PO CAPS
200.0000 mg | ORAL_CAPSULE | ORAL | Status: AC
  Administered 2017-12-21: 200 mg via ORAL

## 2017-12-21 MED ORDER — CELECOXIB 200 MG PO CAPS
ORAL_CAPSULE | ORAL | Status: AC
  Filled 2017-12-21: qty 1

## 2017-12-21 MED ORDER — BUPIVACAINE-EPINEPHRINE (PF) 0.25% -1:200000 IJ SOLN
INTRAMUSCULAR | Status: AC
  Filled 2017-12-21: qty 90

## 2017-12-21 MED ORDER — PROPOFOL 10 MG/ML IV BOLUS
INTRAVENOUS | Status: DC | PRN
  Administered 2017-12-21: 200 mg via INTRAVENOUS

## 2017-12-21 MED ORDER — ONDANSETRON HCL 4 MG/2ML IJ SOLN
INTRAMUSCULAR | Status: DC | PRN
  Administered 2017-12-21: 4 mg via INTRAVENOUS

## 2017-12-21 MED ORDER — OXYCODONE HCL 5 MG PO TABS
ORAL_TABLET | ORAL | Status: AC
  Filled 2017-12-21: qty 1

## 2017-12-21 MED ORDER — ACETAMINOPHEN 160 MG/5ML PO SOLN: 325.0000 mg | ORAL | Status: DC | PRN

## 2017-12-21 MED ORDER — IBUPROFEN 800 MG PO TABS: 800.0000 mg | ORAL_TABLET | Freq: Three times a day (TID) | ORAL | 0 refills | Status: DC | PRN

## 2017-12-21 MED ORDER — ACETAMINOPHEN 325 MG PO TABS: 325.0000 mg | ORAL_TABLET | ORAL | Status: DC | PRN

## 2017-12-21 MED ORDER — GABAPENTIN 300 MG PO CAPS
300.0000 mg | ORAL_CAPSULE | ORAL | Status: AC
  Administered 2017-12-21: 300 mg via ORAL

## 2017-12-21 SURGICAL SUPPLY — 46 items
APPLIER CLIP 9.375 MED OPEN (MISCELLANEOUS)
BINDER BREAST LRG (GAUZE/BANDAGES/DRESSINGS) IMPLANT
BINDER BREAST MEDIUM (GAUZE/BANDAGES/DRESSINGS) IMPLANT
BINDER BREAST XLRG (GAUZE/BANDAGES/DRESSINGS) IMPLANT
BINDER BREAST XXLRG (GAUZE/BANDAGES/DRESSINGS) IMPLANT
BLADE SURG 15 STRL LF DISP TIS (BLADE) ×1 IMPLANT
BLADE SURG 15 STRL SS (BLADE) ×2
CANISTER SUCT 1200ML W/VALVE (MISCELLANEOUS) ×3 IMPLANT
CHLORAPREP W/TINT 26ML (MISCELLANEOUS) ×3 IMPLANT
CLIP APPLIE 9.375 MED OPEN (MISCELLANEOUS) IMPLANT
COVER BACK TABLE 60X90IN (DRAPES) ×3 IMPLANT
COVER MAYO STAND STRL (DRAPES) ×3 IMPLANT
COVER WAND RF STERILE (DRAPES) IMPLANT
DECANTER SPIKE VIAL GLASS SM (MISCELLANEOUS) ×3 IMPLANT
DERMABOND ADVANCED (GAUZE/BANDAGES/DRESSINGS) ×2
DERMABOND ADVANCED .7 DNX12 (GAUZE/BANDAGES/DRESSINGS) ×1 IMPLANT
DEVICE DUBIN W/COMP PLATE 8390 (MISCELLANEOUS) IMPLANT
DRAPE LAPAROSCOPIC ABDOMINAL (DRAPES) IMPLANT
DRAPE LAPAROTOMY 100X72 PEDS (DRAPES) ×3 IMPLANT
DRAPE UTILITY XL STRL (DRAPES) ×3 IMPLANT
ELECT COATED BLADE 2.86 ST (ELECTRODE) ×3 IMPLANT
ELECT REM PT RETURN 9FT ADLT (ELECTROSURGICAL) ×3
ELECTRODE REM PT RTRN 9FT ADLT (ELECTROSURGICAL) ×1 IMPLANT
GLOVE BIOGEL PI IND STRL 8 (GLOVE) ×1 IMPLANT
GLOVE BIOGEL PI INDICATOR 8 (GLOVE) ×2
GLOVE ECLIPSE 8.0 STRL XLNG CF (GLOVE) ×3 IMPLANT
GOWN STRL REUS W/ TWL LRG LVL3 (GOWN DISPOSABLE) ×2 IMPLANT
GOWN STRL REUS W/TWL LRG LVL3 (GOWN DISPOSABLE) ×4
HEMOSTAT ARISTA ABSORB 3G PWDR (MISCELLANEOUS) IMPLANT
KIT MARKER MARGIN INK (KITS) ×3 IMPLANT
NEEDLE HYPO 25X1 1.5 SAFETY (NEEDLE) ×3 IMPLANT
NS IRRIG 1000ML POUR BTL (IV SOLUTION) ×3 IMPLANT
PACK BASIN DAY SURGERY FS (CUSTOM PROCEDURE TRAY) ×3 IMPLANT
PENCIL BUTTON HOLSTER BLD 10FT (ELECTRODE) ×3 IMPLANT
SLEEVE SCD COMPRESS KNEE MED (MISCELLANEOUS) ×3 IMPLANT
SPONGE LAP 4X18 RFD (DISPOSABLE) ×3 IMPLANT
STAPLER VISISTAT 35W (STAPLE) IMPLANT
SUT MON AB 4-0 PC3 18 (SUTURE) ×3 IMPLANT
SUT SILK 2 0 SH (SUTURE) IMPLANT
SUT VICRYL 3-0 CR8 SH (SUTURE) ×3 IMPLANT
SYR CONTROL 10ML LL (SYRINGE) ×3 IMPLANT
TOWEL GREEN STERILE FF (TOWEL DISPOSABLE) ×6 IMPLANT
TOWEL OR NON WOVEN STRL DISP B (DISPOSABLE) ×3 IMPLANT
TUBE CONNECTING 20'X1/4 (TUBING) ×1
TUBE CONNECTING 20X1/4 (TUBING) ×2 IMPLANT
YANKAUER SUCT BULB TIP NO VENT (SUCTIONS) ×3 IMPLANT

## 2017-12-21 NOTE — Anesthesia Procedure Notes (Signed)
Procedure Name: LMA Insertion Date/Time: 12/21/2017 7:30 AM Performed by: Maryella Shivers, CRNA Pre-anesthesia Checklist: Patient identified, Emergency Drugs available, Suction available and Patient being monitored Patient Re-evaluated:Patient Re-evaluated prior to induction Oxygen Delivery Method: Circle system utilized Preoxygenation: Pre-oxygenation with 100% oxygen Induction Type: IV induction Ventilation: Mask ventilation without difficulty LMA: LMA inserted LMA Size: 4.0 Number of attempts: 1 Airway Equipment and Method: Bite block Placement Confirmation: positive ETCO2 Tube secured with: Tape Dental Injury: Teeth and Oropharynx as per pre-operative assessment

## 2017-12-21 NOTE — Transfer of Care (Signed)
Immediate Anesthesia Transfer of Care Note  Patient: Gabrielle Caldwell  Procedure(s) Performed: RE-EXCISION OF RIGHT BREAST LUMPECTOMY (Right Breast)  Patient Location: PACU  Anesthesia Type:General  Level of Consciousness: sedated  Airway & Oxygen Therapy: Patient Spontanous Breathing and Patient connected to face mask oxygen  Post-op Assessment: Report given to RN and Post -op Vital signs reviewed and stable  Post vital signs: Reviewed and stable  Last Vitals:  Vitals Value Taken Time  BP 107/74 12/21/2017  8:10 AM  Temp    Pulse 82 12/21/2017  8:11 AM  Resp 16 12/21/2017  8:11 AM  SpO2 100 % 12/21/2017  8:11 AM  Vitals shown include unvalidated device data.  Last Pain:  Vitals:   12/21/17 0621  TempSrc: Oral  PainSc: 0-No pain      Patients Stated Pain Goal: 5 (11/94/17 4081)  Complications: No apparent anesthesia complications

## 2017-12-21 NOTE — Interval H&P Note (Signed)
History and Physical Interval Note:  12/21/2017 7:21 AM  Gabrielle Caldwell  has presented today for surgery, with the diagnosis of PHYLLODES TUMOR  The various methods of treatment have been discussed with the patient and family. After consideration of risks, benefits and other options for treatment, the patient has consented to  Procedure(s): RE-EXCISION OF RIGHT BREAST LUMPECTOMY (Right) as a surgical intervention .  The patient's history has been reviewed, patient examined, no change in status, stable for surgery.  I have reviewed the patient's chart and labs.  Questions were answered to the patient's satisfaction.     [place]

## 2017-12-21 NOTE — H&P (Signed)
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 Anterior margin was involved therefore she will require reexcision. This is scheduled for November. She is doing well without complaint.  The patient is a 37 year old female.   Allergies Sabino Gasser No Known Drug Allergies [09/06/2017]: Allergies Reconciled  Medication History Sabino Gasser; Gabapentin (Oral prn) Specific strength unknown - Active. Medications Reconciled    Vitals Sabino Gasser;  10/26/2017 9:16 AM Weight: 243.25 lb Height: 62in Body Surface Area: 2.08 m Body Mass Index: 44.49 kg/m  Temp.: 98.53F(Oral)  Pulse: 91 (Regular)  BP: 130/82 (Sitting, Left Arm, Standard)      Physical Exam (Leanora Murin A. Jemia Fata MD;   General Mental Status-Alert. General Appearance-Consistent with stated age. Hydration-Well hydrated. Voice-Normal.  Head and Neck Head-normocephalic, atraumatic with no lesions or palpable masses. Trachea-midline. Thyroid Gland Characteristics - normal size and consistency.  Breast Note: Right breast incision clean dry and intact. No evidence of infection or seroma.  Neurologic Neurologic evaluation reveals -alert and oriented x 3 with no impairment of recent or remote memory. Mental Status-Normal.  Musculoskeletal Normal Exam - Left-Upper Extremity Strength Normal and Lower Extremity Strength Normal. Normal Exam - Right-Upper Extremity Strength Normal and Lower Extremity Strength Normal.    Assessment & Plan (Javanni Maring A. 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  May resume full  activity at this point.  Current Plans Pt Education - [employer] Free Text Education/Instructions: discussed with patient and provided informatio

## 2017-12-21 NOTE — Discharge Instructions (Signed)
[facility] °Office Phone Number [phone] ° °BREAST BIOPSY/ PARTIAL MASTECTOMY: POST OP INSTRUCTIONS ° °Always review your discharge instruction sheet given to you by the facility where your surgery was performed. ° °IF YOU HAVE DISABILITY OR FAMILY LEAVE FORMS, YOU MUST BRING THEM TO THE OFFICE FOR PROCESSING.  DO NOT GIVE THEM TO YOUR DOCTOR. ° °1. A prescription for pain medication may be given to you upon discharge.  Take your pain medication as prescribed, if needed.  If narcotic pain medicine is not needed, then you may take acetaminophen (Tylenol) or ibuprofen (Advil) as needed. °2. Take your usually prescribed medications unless otherwise directed °3. If you need a refill on your pain medication, please contact your pharmacy.  They will contact our office to request authorization.  Prescriptions will not be filled after 5pm or on week-ends. °4. You should eat very light the first 24 hours after surgery, such as soup, crackers, pudding, etc.  Resume your normal diet the day after surgery. °5. Most patients will experience some swelling and bruising in the breast.  Ice packs and a good support bra will help.  Swelling and bruising can take several days to resolve.  °6. It is common to experience some constipation if taking pain medication after surgery.  Increasing fluid intake and taking a stool softener will usually help or prevent this problem from occurring.  A mild laxative (Milk of Magnesia or Miralax) should be taken according to package directions if there are no bowel movements after 48 hours. °7. Unless discharge instructions indicate otherwise, you may remove your bandages 24-48 hours after surgery, and you may shower at that time.  You may have steri-strips (small skin tapes) in place directly over the incision.  These strips should be left on the skin for 7-10 days.  If your surgeon used skin glue on the incision, you may shower in 24 hours.  The glue will flake off over the  next 2-3 weeks.  Any sutures or staples will be removed at the office during your follow-up visit. °8. ACTIVITIES:  You may resume regular daily activities (gradually increasing) beginning the next day.  Wearing a good support bra or sports bra minimizes pain and swelling.  You may have sexual intercourse when it is comfortable. °a. You may drive when you no longer are taking prescription pain medication, you can comfortably wear a seatbelt, and you can safely maneuver your car and apply brakes. °b. RETURN TO WORK:  ______________________________________________________________________________________ °9. You should see your doctor in the office for a follow-up appointment approximately two weeks after your surgery.  Your doctor’s nurse will typically make your follow-up appointment when she calls you with your pathology report.  Expect your pathology report 2-3 business days after your surgery.  You may call to check if you do not hear from us after three days. °10. OTHER INSTRUCTIONS: _______________________________________________________________________________________________ _____________________________________________________________________________________________________________________________________ °_____________________________________________________________________________________________________________________________________ °_____________________________________________________________________________________________________________________________________ ° °WHEN TO CALL YOUR DOCTOR: °1. Fever over 101.0 °2. Nausea and/or vomiting. °3. Extreme swelling or bruising. °4. Continued bleeding from incision. °5. Increased pain, redness, or drainage from the incision. ° °The clinic staff is available to answer your questions during regular business hours.  Please don’t hesitate to call and ask to speak to one of the nurses for clinical concerns.  If you have a medical emergency, go to the nearest  emergency room or call 911.  A surgeon from [facility] is always on call at the hospital. ° °For further questions, please visit [email or web address]  ° ° ° °  Post Anesthesia Home Care Instructions  Activity: Get plenty of rest for the remainder of the day. A responsible individual must stay with you for 24 hours following the procedure.  For the next 24 hours, DO NOT: -Drive a car -Paediatric nurse -Drink alcoholic beverages -Take any medication unless instructed by your physician -Make any legal decisions or sign important papers.  Meals: Start with liquid foods such as gelatin or soup. Progress to regular foods as tolerated. Avoid greasy, spicy, heavy foods. If nausea and/or vomiting occur, drink only clear liquids until the nausea and/or vomiting subsides. Call your physician if vomiting continues.  Special Instructions/Symptoms: Your throat may feel dry or sore from the anesthesia or the breathing tube placed in your throat during surgery. If this causes discomfort, gargle with warm salt water. The discomfort should disappear within 24 hours.  If you had a scopolamine patch placed behind your ear for the management of post- operative nausea and/or vomiting:  1. The medication in the patch is effective for 72 hours, after which it should be removed.  Wrap patch in a tissue and discard in the trash. Wash hands thoroughly with soap and water. 2. You may remove the patch earlier than 72 hours if you experience unpleasant side effects which may include dry mouth, dizziness or visual disturbances. 3. Avoid touching the patch. Wash your hands with soap and water after contact with the patch.     You had oxycodone IR 5 mg orally at 0915am.

## 2017-12-21 NOTE — Op Note (Signed)
Preoperative diagnosis: Right breast phyllodes tumor  Postoperative diagnosis: Same  Procedure: Reexcision right breast lumpectomy  Surgeon: Erroll Luna, MD  Anesthesia: LMA with local  EBL: 10 cc  Specimen: Additional anterior margin sent to pathology after orientation with ink  Drains: None  Indications for procedure: The patient is a 37 year old female who underwent excision of a fluid is tumor.  The anterior margin had a focal area of positivity.  I discussed observation versus reexcision.  She opted for reexcision to reduce her risk of recurrence.The procedure has been discussed with the patient. Alternatives to surgery have been discussed with the patient.  Risks of surgery include bleeding,  Infection,  Seroma formation, death,  and the need for further surgery.   The patient understands and wishes to proceed.   Description of procedure: The patient was met in the holding area and questions were answered.  Right breast was marked as the correct side.  She was then taken back to the operative room.  After induction of general anesthesia the right breast was prepped and draped in sterile fashion timeout was done.  Curvilinear incision was made to the old scar along the lateral border of the nipple areolar complex.  The lumpectomy cavity was opened.  The anterior margin of the specimen that was a Enis Slipper tumor which was more anterior of the 2 masses removed which were excised.  Margins are grossly negative.  The cavity was irrigated and closed with 3-0 Vicryl and 4-0 Monocryl.  Dermabond was applied.  Local anesthetic was infiltrated around the lumpectomy site.  All counts are correct.  The patient was awoke extubated taken to recovery in satisfactory condition.

## 2017-12-21 NOTE — Anesthesia Postprocedure Evaluation (Signed)
Anesthesia Post Note  Patient: Gabrielle Caldwell  Procedure(s) Performed: RE-EXCISION OF RIGHT BREAST LUMPECTOMY (Right Breast)     Patient location during evaluation: PACU Anesthesia Type: General Level of consciousness: sedated Pain management: pain level controlled Vital Signs Assessment: post-procedure vital signs reviewed and stable Respiratory status: spontaneous breathing Cardiovascular status: stable Postop Assessment: no apparent nausea or vomiting Anesthetic complications: no    Last Vitals:  Vitals:   12/21/17 0815 12/21/17 0830  BP: 116/70 114/72  Pulse: 91 65  Resp: 14 17  Temp:    SpO2: 100% 100%    Last Pain:  Vitals:   12/21/17 0830  TempSrc:   PainSc: 7    Pain Goal: Patients Stated Pain Goal: 3 (12/21/17 0830)               Huston Foley

## 2017-12-22 ENCOUNTER — Encounter (HOSPITAL_BASED_OUTPATIENT_CLINIC_OR_DEPARTMENT_OTHER): Payer: Self-pay | Admitting: Surgery

## 2018-03-28 ENCOUNTER — Ambulatory Visit: Payer: 59 | Admitting: Gynecology

## 2018-04-18 ENCOUNTER — Telehealth: Payer: Self-pay | Admitting: *Deleted

## 2018-04-18 DIAGNOSIS — B009 Herpesviral infection, unspecified: Secondary | ICD-10-CM

## 2018-04-18 MED ORDER — VALACYCLOVIR HCL 500 MG PO TABS: ORAL_TABLET | ORAL | 0 refills | Status: DC

## 2018-04-18 NOTE — Telephone Encounter (Signed)
Patient called to cancel annual exam, now scheduled on 06/24/18 due to COVID-19 patient son has respiratory issues. Needs refill on Valtrex 500 mg tablet.

## 2018-04-20 ENCOUNTER — Encounter: Payer: 59 | Admitting: Women's Health

## 2018-05-25 ENCOUNTER — Encounter: Payer: 59 | Admitting: Women's Health

## 2018-10-17 ENCOUNTER — Encounter: Payer: Self-pay | Admitting: Gynecology

## 2018-10-17 ENCOUNTER — Telehealth: Payer: Self-pay

## 2018-10-17 DIAGNOSIS — B009 Herpesviral infection, unspecified: Secondary | ICD-10-CM

## 2018-10-17 NOTE — Telephone Encounter (Signed)
Requests refill on Valtrex x 30 day supply. Last CE was 11/16/2016. She is scheduled 11/23/18.  Ok to send Rx until visit?

## 2018-10-18 MED ORDER — VALACYCLOVIR HCL 500 MG PO TABS: ORAL_TABLET | ORAL | 0 refills | Status: DC

## 2018-10-18 NOTE — Telephone Encounter (Signed)
Okay for refill?  

## 2018-10-18 NOTE — Telephone Encounter (Signed)
Patient informed. 

## 2018-11-22 ENCOUNTER — Other Ambulatory Visit: Payer: Self-pay

## 2018-11-23 ENCOUNTER — Ambulatory Visit (INDEPENDENT_AMBULATORY_CARE_PROVIDER_SITE_OTHER): Payer: 59 | Admitting: Women's Health

## 2018-11-23 ENCOUNTER — Encounter: Payer: Self-pay | Admitting: Women's Health

## 2018-11-23 VITALS — BP 110/78 | Ht 62.0 in | Wt 238.0 lb

## 2018-11-23 DIAGNOSIS — Z113 Encounter for screening for infections with a predominantly sexual mode of transmission: Secondary | ICD-10-CM | POA: Diagnosis not present

## 2018-11-23 DIAGNOSIS — Z01419 Encounter for gynecological examination (general) (routine) without abnormal findings: Secondary | ICD-10-CM | POA: Diagnosis not present

## 2018-11-23 DIAGNOSIS — Z13220 Encounter for screening for lipoid disorders: Secondary | ICD-10-CM

## 2018-11-23 LAB — URINALYSIS, COMPLETE W/RFL CULTURE
Bilirubin Urine: NEGATIVE
Glucose, UA: NEGATIVE
Hgb urine dipstick: NEGATIVE
Leukocyte Esterase: NEGATIVE
Nitrites, Initial: NEGATIVE
Protein, ur: NEGATIVE
Specific Gravity, Urine: 1.028 (ref 1.001–1.03)
pH: 5.5 (ref 5.0–8.0)

## 2018-11-23 LAB — NO CULTURE INDICATED

## 2018-11-23 NOTE — Progress Notes (Signed)
Gabrielle Caldwell [birth year] [id number]    History:    Presents for annual exam.  12/2013 Mirena IUD rare spotting.  New partner.  2002 LGSIL with normal Paps after.  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  Adopted.  Past medical history, past surgical history, family history and social history were all reviewed and documented in the EPIC chart.  Works for [employer].  Gabrielle Caldwell is 10 doing well.  ROS:  A ROS was performed and pertinent positives and negatives are included.  Exam:  Vitals:   11/23/18 0847  BP: 110/78  Weight: 238 lb (108 kg)  Height: 5\' 2"  (1.575 m)   Body mass index is 43.53 kg/m.   General appearance:  Normal Thyroid:  Symmetrical, normal in size, without palpable masses or nodularity. Respiratory  Auscultation:  Clear without wheezing or rhonchi Cardiovascular  Auscultation:  Regular rate, without rubs, murmurs or gallops  Edema/varicosities:  Not grossly evident Abdominal  Soft,nontender, without masses, guarding or rebound.  Liver/spleen:  No organomegaly noted  Hernia:  None appreciated  Skin  Inspection:  Grossly normal   Breasts: Examined has done well with Mirena and sitting.     Right: Without masses, retractions, discharge or axillary adenopathy.     Left: Without masses, retractions, discharge or axillary adenopathy. Gentitourinary   Inguinal/mons:  Normal without inguinal adenopathy  External genitalia:  Normal  BUS/Urethra/Skene's glands:  Normal  Vagina:  Normal  Cervix:  Normal IUD string not visible  Uterus:  normal in size, shape and contour.  Midline and mobile  Adnexa/parametria:     Rt: Without masses or tenderness.   Lt: Without masses or tenderness.  Anus and perineum: Normal  Digital rectal exam: Normal sphincter tone without palpated masses or tenderness  Assessment/Plan:  38 y.o. S BF G2 P1 for annual exam with no complaints.  12/2013 Mirena IUD rare spotting 09/2017 phyllodes right breast  tumor scheduled follow-up Obesity STD screen  Plan: Contraception reviewed, has done well with Mirena would like to have a repeat.  Schedule removal, replacement of Mirena IUD with Dr. Phineas Real in December.  SBEs, continue follow-up as scheduled as recommended.  Increase regular exercise, decrease calorie/carbs vitamin D 1000 daily encouraged.  CBC, CMP, lipid panel, HIV, RPR, GC/chlamydia.  Pap normal 2019 with negative HR HPV typing, new screening guidelines reviewed.[facility], 9:07 AM 11/23/2018

## 2018-11-23 NOTE — Patient Instructions (Addendum)
It was good to see you today!  Health Maintenance, Female Adopting a healthy lifestyle and getting preventive care are important in promoting health and wellness. Ask your health care provider about:  The right schedule for you to have regular tests and exams.  Things you can do on your own to prevent diseases and keep yourself healthy. What should I know about diet, weight, and exercise? Eat a healthy diet   Eat a diet that includes plenty of vegetables, fruits, low-fat dairy products, and lean protein.  Do not eat a lot of foods that are high in solid fats, added sugars, or sodium. Maintain a healthy weight Body mass index (BMI) is used to identify weight problems. It estimates body fat based on height and weight. Your health care provider can help determine your BMI and help you achieve or maintain a healthy weight. Get regular exercise Get regular exercise. This is one of the most important things you can do for your health. Most adults should:  Exercise for at least 150 minutes each week. The exercise should increase your heart rate and make you sweat (moderate-intensity exercise).  Do strengthening exercises at least twice a week. This is in addition to the moderate-intensity exercise.  Spend less time sitting. Even light physical activity can be beneficial. Watch cholesterol and blood lipids Have your blood tested for lipids and cholesterol at 36 years of age, then have this test every 5 years. Have your cholesterol levels checked more often if:  Your lipid or cholesterol levels are high.  You are older than 37 years of age.  You are at high risk for heart disease. What should I know about cancer screening? Depending on your health history and family history, you may need to have cancer screening at various ages. This may include screening for:  Breast cancer.  Cervical cancer.  Colorectal cancer.  Skin cancer.  Lung cancer. What should I know about heart disease,  diabetes, and high blood pressure? Blood pressure and heart disease  High blood pressure causes heart disease and increases the risk of stroke. This is more likely to develop in people who have high blood pressure readings, are of African descent, or are overweight.  Have your blood pressure checked: ? Every 3-5 years if you are 25-4 years of age. ? Every year if you are 45 years old or older. Diabetes Have regular diabetes screenings. This checks your fasting blood sugar level. Have the screening done:  Once every three years after age 36 if you are at a normal weight and have a low risk for diabetes.  More often and at a younger age if you are overweight or have a high risk for diabetes. What should I know about preventing infection? Hepatitis B If you have a higher risk for hepatitis B, you should be screened for this virus. Talk with your health care provider to find out if you are at risk for hepatitis B infection. Hepatitis C Testing is recommended for:  Everyone born from [birth year] through 1963.  Anyone with known risk factors for hepatitis C. Sexually transmitted infections (STIs)  Get screened for STIs, including gonorrhea and chlamydia, if: ? You are sexually active and are younger than 36 years of age. ? You are older than 36 years of age and your health care provider tells you that you are at risk for this type of infection. ? Your sexual activity has changed since you were last screened, and you are at increased risk for chlamydia  or gonorrhea. Ask your health care provider if you are at risk.  Ask your health care provider about whether you are at high risk for HIV. Your health care provider may recommend a prescription medicine to help prevent HIV infection. If you choose to take medicine to prevent HIV, you should first get tested for HIV. You should then be tested every 3 months for as long as you are taking the medicine. Pregnancy  If you are about to stop having your  period (premenopausal) and you may become pregnant, seek counseling before you get pregnant.  Take 400 to 800 micrograms (mcg) of folic acid every day if you become pregnant.  Ask for birth control (contraception) if you want to prevent pregnancy. Osteoporosis and menopause Osteoporosis is a disease in which the bones lose minerals and strength with aging. This can result in bone fractures. If you are 75 years old or older, or if you are at risk for osteoporosis and fractures, ask your health care provider if you should:  Be screened for bone loss.  Take a calcium or vitamin D supplement to lower your risk of fractures.  Be given hormone replacement therapy (HRT) to treat symptoms of menopause. Follow these instructions at home: Lifestyle  Do not use any products that contain nicotine or tobacco, such as cigarettes, e-cigarettes, and chewing tobacco. If you need help quitting, ask your health care provider.  Do not use street drugs.  Do not share needles.  Ask your health care provider for help if you need support or information about quitting drugs. Alcohol use  Do not drink alcohol if: ? Your health care provider tells you not to drink. ? You are pregnant, may be pregnant, or are planning to become pregnant.  If you drink alcohol: ? Limit how much you use to 0-1 drink a day. ? Limit intake if you are breastfeeding.  Be aware of how much alcohol is in your drink. In the [place] one drink equals one 12 oz bottle of beer (355 mL), one 5 oz glass of wine (148 mL), or one 1 oz glass of hard liquor (44 mL). General instructions  Schedule regular health, dental, and eye exams.  Stay current with your vaccines.  Tell your health care provider if: ? You often feel depressed. ? You have ever been abused or do not feel safe at home. Summary  Adopting a healthy lifestyle and getting preventive care are important in promoting health and wellness.  Follow your health care provider's  instructions about healthy diet, exercising, and getting tested or screened for diseases.  Follow your health care provider's instructions on monitoring your cholesterol and blood pressure. This information is not intended to replace advice given to you by your health care provider. Make sure you discuss any questions you have with your health care provider. Document Released: 07/28/2010 Document Revised: 01/05/2018 Document Reviewed: 01/05/2018 Elsevier Patient Education  2020 [employer].

## 2018-11-23 NOTE — Addendum Note (Signed)
Addended by: Lorine Bears on: 11/23/2018 09:36 AM   Modules accepted: Orders

## 2018-11-24 LAB — COMPREHENSIVE METABOLIC PANEL
AG Ratio: 1.5 (calc) (ref 1.0–2.5)
ALT: 16 U/L (ref 6–29)
AST: 16 U/L (ref 10–30)
Albumin: 4 g/dL (ref 3.6–5.1)
Alkaline phosphatase (APISO): 57 U/L (ref 31–125)
BUN/Creatinine Ratio: 9 (calc) (ref 6–22)
BUN: 10 mg/dL (ref 7–25)
CO2: 27 mmol/L (ref 20–32)
Calcium: 8.9 mg/dL (ref 8.6–10.2)
Chloride: 103 mmol/L (ref 98–110)
Creat: 1.11 mg/dL — ABNORMAL HIGH (ref 0.50–1.10)
Globulin: 2.7 g/dL (calc) (ref 1.9–3.7)
Glucose, Bld: 92 mg/dL (ref 65–99)
Potassium: 3.9 mmol/L (ref 3.5–5.3)
Sodium: 137 mmol/L (ref 135–146)
Total Bilirubin: 0.4 mg/dL (ref 0.2–1.2)
Total Protein: 6.7 g/dL (ref 6.1–8.1)

## 2018-11-24 LAB — CBC WITH DIFFERENTIAL/PLATELET
Absolute Monocytes: 383 cells/uL (ref 200–950)
Basophils Absolute: 22 cells/uL (ref 0–200)
Basophils Relative: 0.5 %
Eosinophils Absolute: 48 cells/uL (ref 15–500)
Eosinophils Relative: 1.1 %
HCT: 37.6 % (ref 35.0–45.0)
Hemoglobin: 12.5 g/dL (ref 11.7–15.5)
Lymphs Abs: 1355 cells/uL (ref 850–3900)
MCH: 30.5 pg (ref 27.0–33.0)
MCHC: 33.2 g/dL (ref 32.0–36.0)
MCV: 91.7 fL (ref 80.0–100.0)
MPV: 9.3 fL (ref 7.5–12.5)
Monocytes Relative: 8.7 %
Neutro Abs: 2592 cells/uL (ref 1500–7800)
Neutrophils Relative %: 58.9 %
Platelets: 306 10*3/uL (ref 140–400)
RBC: 4.1 10*6/uL (ref 3.80–5.10)
RDW: 12.5 % (ref 11.0–15.0)
Total Lymphocyte: 30.8 %
WBC: 4.4 10*3/uL (ref 3.8–10.8)

## 2018-11-24 LAB — RPR: RPR Ser Ql: NONREACTIVE

## 2018-11-24 LAB — LIPID PANEL
Cholesterol: 177 mg/dL (ref <200)
HDL: 46 mg/dL — ABNORMAL LOW (ref >=50)
LDL Cholesterol (Calc): 116 mg/dL (calc) — ABNORMAL HIGH
Non-HDL Cholesterol (Calc): 131 mg/dL (calc) — ABNORMAL HIGH (ref <130)
Total CHOL/HDL Ratio: 3.8 (calc) (ref <5.0)
Triglycerides: 60 mg/dL (ref <150)

## 2018-11-24 LAB — HIV ANTIBODY (ROUTINE TESTING W REFLEX): HIV 1&2 Ab, 4th Generation: NONREACTIVE

## 2018-11-25 LAB — TEST AUTHORIZATION

## 2018-11-25 LAB — CHLAMYDIA PROBE AMP THINPREP: C. trachomatis RNA, TMA: NOT DETECTED

## 2018-11-25 LAB — GC PROBE AMP THINPREP: N. gonorrhoeae RNA, TMA: NOT DETECTED

## 2018-12-09 ENCOUNTER — Telehealth: Payer: Self-pay | Admitting: *Deleted

## 2018-12-09 DIAGNOSIS — B009 Herpesviral infection, unspecified: Secondary | ICD-10-CM

## 2018-12-09 MED ORDER — VALACYCLOVIR HCL 500 MG PO TABS: ORAL_TABLET | ORAL | 1 refills | Status: DC

## 2018-12-09 NOTE — Telephone Encounter (Signed)
My chart message came back unread, patient informed.  

## 2018-12-09 NOTE — Telephone Encounter (Signed)
Patient called requesting a refill on valtrex 500 mg tablet,Rx sent.

## 2019-01-02 ENCOUNTER — Other Ambulatory Visit: Payer: Self-pay

## 2019-01-03 ENCOUNTER — Encounter: Payer: Self-pay | Admitting: Gynecology

## 2019-01-03 ENCOUNTER — Ambulatory Visit (INDEPENDENT_AMBULATORY_CARE_PROVIDER_SITE_OTHER): Payer: 59 | Admitting: Gynecology

## 2019-01-03 VITALS — BP 130/78

## 2019-01-03 DIAGNOSIS — Z30433 Encounter for removal and reinsertion of intrauterine contraceptive device: Secondary | ICD-10-CM

## 2019-01-03 HISTORY — PX: INTRAUTERINE DEVICE INSERTION: SHX323

## 2019-01-03 NOTE — Patient Instructions (Signed)

## 2019-01-03 NOTE — Progress Notes (Signed)
    Gabrielle Caldwell 22-May-1980 [id number]        38 y.o.  G2P1011  presents for Mirena IUD replacement.  She is at her 5-year mark with her current Mirena IUD.  She has read through the booklet, has no contraindications and signed the consent form. She currently is on a normal menses.  I reviewed the insertional process with her as well as the risks to include infection, either immediate or long-term, uterine perforation or migration requiring surgery to remove, other complications such as pain, hormonal side effects and possibility of failure with subsequent pregnancy.   Exam with Caryn Bee assistant Vitals:   01/03/19 0831  BP: 130/78    Pelvic: External BUS vagina normal. Cervix normal with light menses flow.  IUD string visualized. Uterus anteverted normal size shape contour midline mobile nontender. Adnexa without masses or tenderness.  Procedure: The cervix was visualized with a speculum and the old Mirena IUD string was grasped with a [place] forcep, the IUD removed, shown to the patient and discarded.  The cervix was then cleansed with Betadine, anterior lip grasped with a single-tooth tenaculum, the uterus was sounded and a Mirena IUD was placed according to manufacturer's recommendations without difficulty. The strings were trimmed. The patient tolerated well and will follow up in one month for a postinsertional check.  Lot number:  [id number]    Anastasio Auerbach MD, 9:04 AM 01/03/2019

## 2019-02-05 IMAGING — MG BREAST SURGICAL SPECIMEN
1 series · 1 of 1 positions shown · non-contrast
Comparison: Previous exam(s).

CLINICAL DATA: Post right breast excision.

EXAM:
SPECIMEN RADIOGRAPH OF THE RIGHT BREAST

[R]
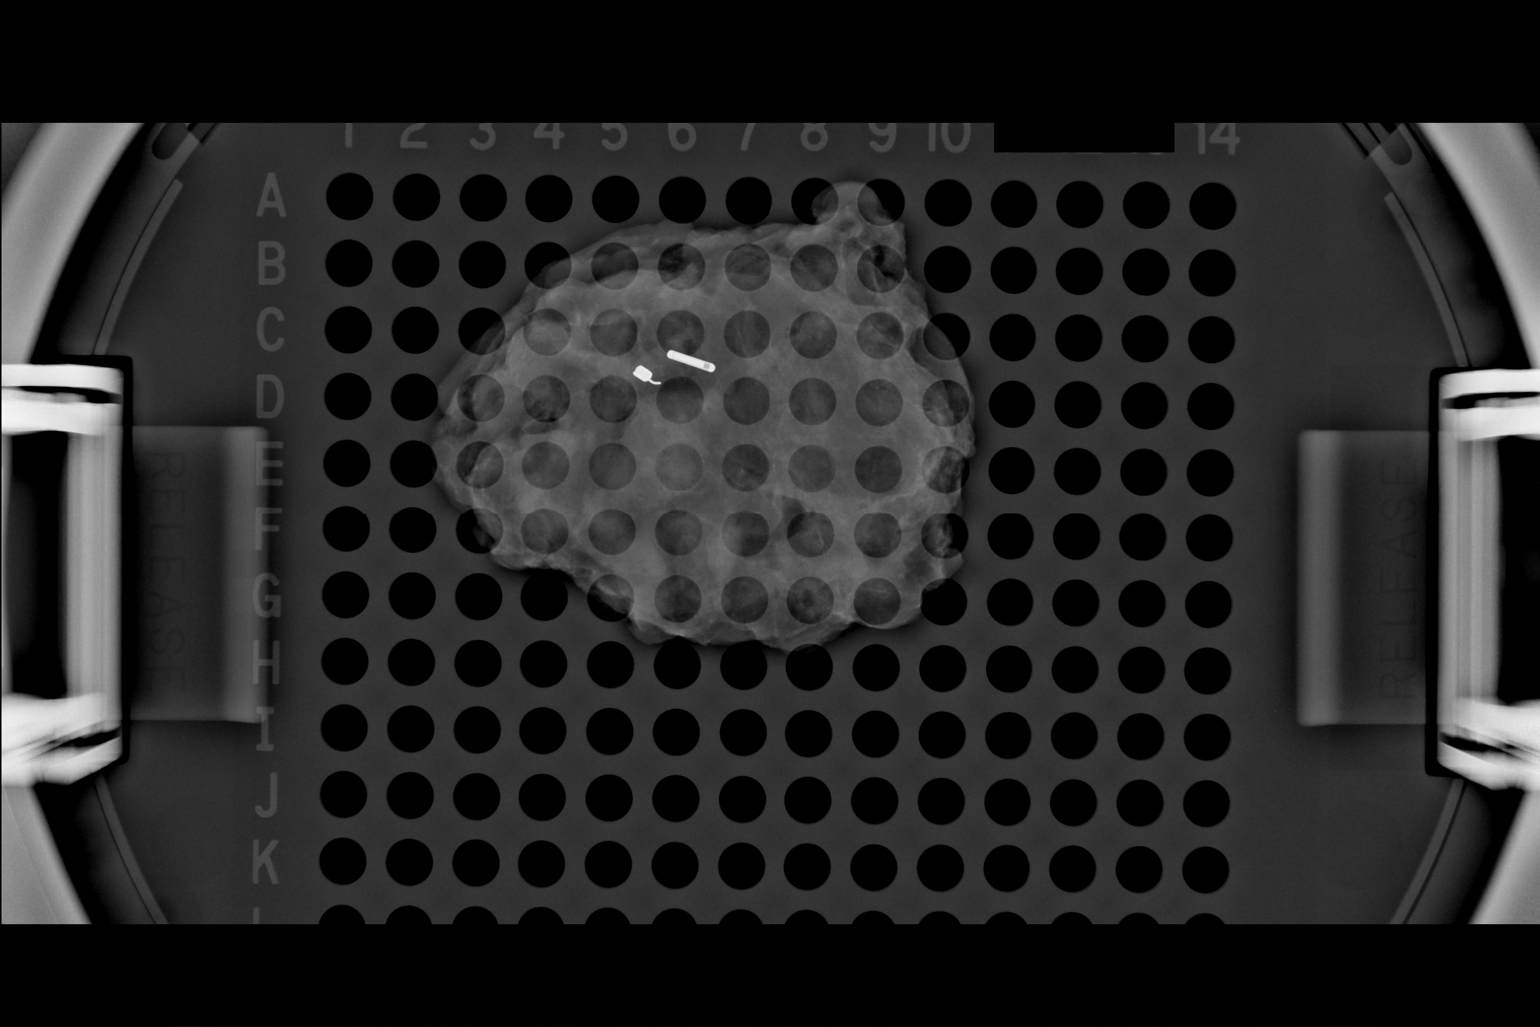

[1 of 1 positions shown; findings below may reference images not displayed]

FINDINGS: Status post excision of the right breast. The radioactive seed and
coil shaped biopsy marker clip are present, completely intact, and
were marked for pathology.
IMPRESSION: Specimen radiograph of the right breast.

## 2019-02-05 IMAGING — MG BREAST SURGICAL SPECIMEN
1 series · 1 of 1 positions shown · non-contrast
Comparison: Previous exam(s).

CLINICAL DATA: Post right breast excision.

EXAM:
SPECIMEN RADIOGRAPH OF THE RIGHT BREAST

[R]
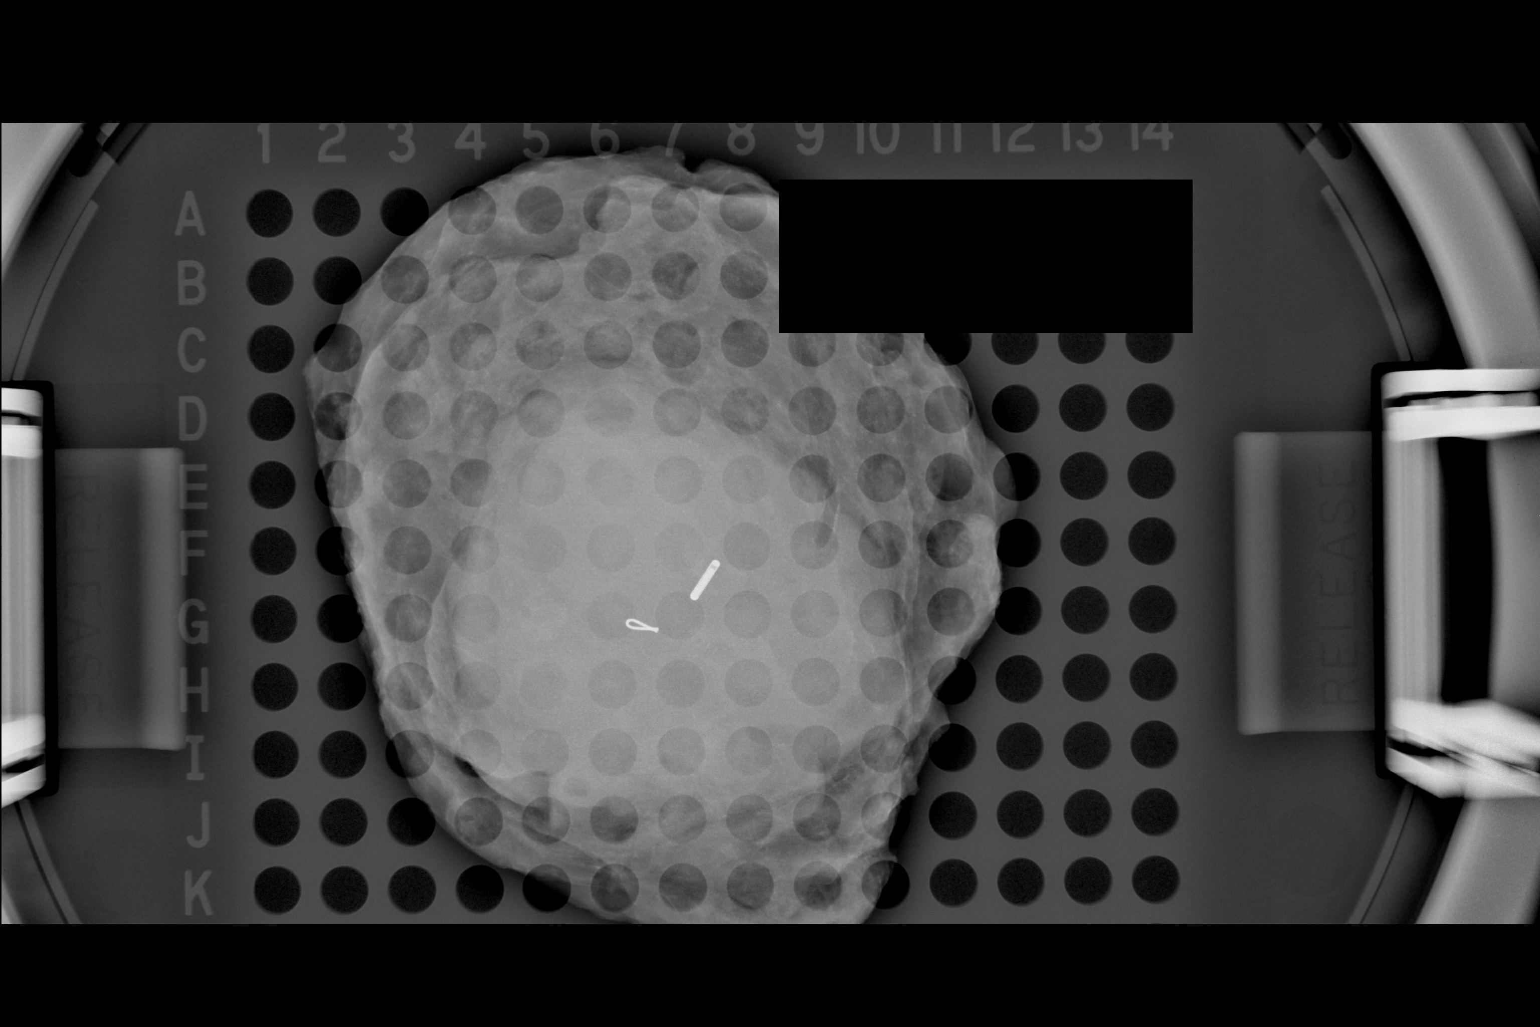

[1 of 1 positions shown; findings below may reference images not displayed]

FINDINGS: Status post excision of the right breast. The radioactive seed and
ribbon shaped biopsy marker clip both located within the mass are
present, completely intact, and were marked for pathology.
IMPRESSION: Specimen radiograph of the right breast.

## 2019-02-06 ENCOUNTER — Other Ambulatory Visit: Payer: Self-pay

## 2019-02-07 ENCOUNTER — Ambulatory Visit (INDEPENDENT_AMBULATORY_CARE_PROVIDER_SITE_OTHER): Payer: 59 | Admitting: Women's Health

## 2019-02-07 ENCOUNTER — Encounter: Payer: Self-pay | Admitting: Women's Health

## 2019-02-07 VITALS — BP 118/76

## 2019-02-07 DIAGNOSIS — Z975 Presence of (intrauterine) contraceptive device: Secondary | ICD-10-CM | POA: Diagnosis not present

## 2019-02-07 DIAGNOSIS — Z30431 Encounter for routine checking of intrauterine contraceptive device: Secondary | ICD-10-CM | POA: Diagnosis not present

## 2019-02-07 NOTE — Progress Notes (Signed)
39 yo SBF presents for Mirena IUD monitoring. Mirena IUD removed and inserted 01/03/2019. Same partner, light cycle end of December no monthly cycle, no complaints.  Reports became amenorrheic after last IUD in several months.  History of dysmenorrhea with menorrhagia and has had good relief with Mirena IUD.  Negative STD screen 11/23/18, same partner.  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 Healthy 82 yo son, no desire for future pregnancy at this time.  HSV no outbreaks.   Exam: appears well. external genitalia within normal limits. Speculum exam Mirena IUD strings visible at 4:00.   01/03/19 Mirena IUD follow-up    Plan: no complaints with Mirena, aware IUD is good for 5 years.  Reviewed normality of exam.

## 2019-02-22 ENCOUNTER — Ambulatory Visit: Payer: 59 | Attending: Internal Medicine

## 2019-02-22 DIAGNOSIS — Z20822 Contact with and (suspected) exposure to covid-19: Secondary | ICD-10-CM

## 2019-02-23 LAB — NOVEL CORONAVIRUS, NAA: SARS-CoV-2, NAA: NOT DETECTED

## 2019-03-27 ENCOUNTER — Other Ambulatory Visit: Payer: Self-pay

## 2019-03-28 ENCOUNTER — Encounter: Payer: Self-pay | Admitting: Women's Health

## 2019-03-28 ENCOUNTER — Ambulatory Visit (INDEPENDENT_AMBULATORY_CARE_PROVIDER_SITE_OTHER): Payer: 59 | Admitting: Women's Health

## 2019-03-28 VITALS — BP 110/80

## 2019-03-28 DIAGNOSIS — N898 Other specified noninflammatory disorders of vagina: Secondary | ICD-10-CM

## 2019-03-28 LAB — WET PREP FOR TRICH, YEAST, CLUE

## 2019-03-28 MED ORDER — METRONIDAZOLE 500 MG PO TABS: 500.0000 mg | ORAL_TABLET | Freq: Two times a day (BID) | ORAL | 0 refills | Status: DC

## 2019-03-28 NOTE — Patient Instructions (Addendum)
Best wishes on next adventure Bacterial Vaginosis  Bacterial vaginosis is a vaginal infection that occurs when the normal balance of bacteria in the vagina is disrupted. It results from an overgrowth of certain bacteria. This is the most common vaginal infection among women ages 27-44. Because bacterial vaginosis increases your risk for STIs (sexually transmitted infections), getting treated can help reduce your risk for chlamydia, gonorrhea, herpes, and HIV (human immunodeficiency virus). Treatment is also important for preventing complications in pregnant women, because this condition can cause an early (premature) delivery. What are the causes? This condition is caused by an increase in harmful bacteria that are normally present in small amounts in the vagina. However, the reason that the condition develops is not fully understood. What increases the risk? The following factors may make you more likely to develop this condition:  Having a new sexual partner or multiple sexual partners.  Having unprotected sex.  Douching.  Having an intrauterine device (IUD).  Smoking.  Drug and alcohol abuse.  Taking certain antibiotic medicines.  Being pregnant. You cannot get bacterial vaginosis from toilet seats, bedding, swimming pools, or contact with objects around you. What are the signs or symptoms? Symptoms of this condition include:  Grey or white vaginal discharge. The discharge can also be watery or foamy.  A fish-like odor with discharge, especially after sexual intercourse or during menstruation.  Itching in and around the vagina.  Burning or pain with urination. Some women with bacterial vaginosis have no signs or symptoms. How is this diagnosed? This condition is diagnosed based on:  Your medical history.  A physical exam of the vagina.  Testing a sample of vaginal fluid under a microscope to look for a large amount of bad bacteria or abnormal cells. Your health care  provider may use a cotton swab or a small wooden spatula to collect the sample. How is this treated? This condition is treated with antibiotics. These may be given as a pill, a vaginal cream, or a medicine that is put into the vagina (suppository). If the condition comes back after treatment, a second round of antibiotics may be needed. Follow these instructions at home: Medicines  Take over-the-counter and prescription medicines only as told by your health care provider.  Take or use your antibiotic as told by your health care provider. Do not stop taking or using the antibiotic even if you start to feel better. General instructions  If you have a female sexual partner, tell her that you have a vaginal infection. She should see her health care provider and be treated if she has symptoms. If you have a female sexual partner, he does not need treatment.  During treatment: ? Avoid sexual activity until you finish treatment. ? Do not douche. ? Avoid alcohol as directed by your health care provider. ? Avoid breastfeeding as directed by your health care provider.  Drink enough water and fluids to keep your urine clear or pale yellow.  Keep the area around your vagina and rectum clean. ? Wash the area daily with warm water. ? Wipe yourself from front to back after using the toilet.  Keep all follow-up visits as told by your health care provider. This is important. How is this prevented?  Do not douche.  Wash the outside of your vagina with warm water only.  Use protection when having sex. This includes latex condoms and dental dams.  Limit how many sexual partners you have. To help prevent bacterial vaginosis, it is best to have  sex with just one partner (monogamous).  Make sure you and your sexual partner are tested for STIs.  Wear cotton or cotton-lined underwear.  Avoid wearing tight pants and pantyhose, especially during summer.  Limit the amount of alcohol that you drink.  Do  not use any products that contain nicotine or tobacco, such as cigarettes and e-cigarettes. If you need help quitting, ask your health care provider.  Do not use illegal drugs. Where to find more information  Centers for Disease Control and Prevention: AppraiserFraud.fi  American Sexual Health Association (ASHA): www.ashastd.org  U.S. Department of Health and Financial controller, Office on Women's Health: DustingSprays.pl or SecuritiesCard.it Contact a health care provider if:  Your symptoms do not improve, even after treatment.  You have more discharge or pain when urinating.  You have a fever.  You have pain in your abdomen.  You have pain during sex.  You have vaginal bleeding between periods. Summary  Bacterial vaginosis is a vaginal infection that occurs when the normal balance of bacteria in the vagina is disrupted.  Because bacterial vaginosis increases your risk for STIs (sexually transmitted infections), getting treated can help reduce your risk for chlamydia, gonorrhea, herpes, and HIV (human immunodeficiency virus). Treatment is also important for preventing complications in pregnant women, because the condition can cause an early (premature) delivery.  This condition is treated with antibiotic medicines. These may be given as a pill, a vaginal cream, or a medicine that is put into the vagina (suppository). This information is not intended to replace advice given to you by your health care provider. Make sure you discuss any questions you have with your health care provider. Document Revised: 12/25/2016 Document Reviewed: 09/28/2015 Elsevier Patient Education  2020 [employer].

## 2019-03-28 NOTE — Progress Notes (Signed)
39 year old S WF G2, P1 presents to have IUD strings check states partner can feel the strings.  Also having white  vaginal discharge.  12/2018 Mirena IUD placed rare spotting.  Same partner with negative STD screen.  Reports relationship is good and planning to move to [place].  He denies urinary symptoms, abdominal or back pain, fever or discharge with itching or an odor.  No medical problems.  Exam: Appears well.  No CVAT.  Abdomen soft, obese, nontender, external genitalia within normal limits, speculum exam IUD strings visible appear to be curving around the cervix, wet prep positive for clues, TNTC bacteria.  Bimanual no CMT or adnexal tenderness.  Strings appear to be appropriate length.  Bacterial vaginosis  Plan: Reviewed strings at an appropriate length, reviewed if shortened may cause a poking sensation with intercourse.  Flagyl 500 twice daily for 7 days alcohol precautions reviewed.  Instructed to call if vaginal discharge persists.  Reassurance given, reviewed strings soften with time as well.

## 2019-11-28 ENCOUNTER — Encounter: Payer: Medicaid Other | Admitting: Nurse Practitioner

## 2019-12-13 ENCOUNTER — Other Ambulatory Visit: Payer: Self-pay

## 2019-12-13 DIAGNOSIS — B009 Herpesviral infection, unspecified: Secondary | ICD-10-CM

## 2019-12-13 MED ORDER — VALACYCLOVIR HCL 500 MG PO TABS: ORAL_TABLET | ORAL | 0 refills | Status: DC

## 2019-12-13 NOTE — Telephone Encounter (Signed)
Patient is scheduled for CE with TW on 01/03/20.

## 2020-01-03 ENCOUNTER — Encounter: Payer: Medicaid Other | Admitting: Nurse Practitioner

## 2020-01-26 ENCOUNTER — Other Ambulatory Visit: Payer: Self-pay | Admitting: Nurse Practitioner

## 2020-01-26 DIAGNOSIS — B009 Herpesviral infection, unspecified: Secondary | ICD-10-CM

## 2022-03-31 ENCOUNTER — Encounter: Payer: Self-pay | Admitting: Emergency Medicine

## 2022-03-31 ENCOUNTER — Ambulatory Visit
Admission: EM | Admit: 2022-03-31 | Discharge: 2022-03-31 | Disposition: A | Discharge status: Home / Self Care | Payer: Medicaid Other | Attending: Internal Medicine | Admitting: Internal Medicine

## 2022-03-31 DIAGNOSIS — M545 Low back pain, unspecified: Secondary | ICD-10-CM | POA: Diagnosis not present

## 2022-03-31 DIAGNOSIS — M546 Pain in thoracic spine: Secondary | ICD-10-CM

## 2022-03-31 MED ORDER — CYCLOBENZAPRINE HCL 5 MG PO TABS: 5.0000 mg | ORAL_TABLET | Freq: Two times a day (BID) | ORAL | 0 refills | Status: DC | PRN

## 2022-03-31 NOTE — Discharge Instructions (Signed)
I have prescribed you a muscle relaxer to take as needed.  Please be advised this can make you drowsy so no driving or drinking alcohol with it.  Alternate ice and heat to affected areas.  Follow-up if any symptoms persist or worsen.
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
## 2022-09-18 ENCOUNTER — Ambulatory Visit (INDEPENDENT_AMBULATORY_CARE_PROVIDER_SITE_OTHER): Payer: 59 | Admitting: Family Medicine

## 2022-09-18 ENCOUNTER — Encounter: Payer: Self-pay | Admitting: Family Medicine

## 2022-09-18 VITALS — BP 120/78 | HR 75 | Temp 98.8°F | Ht 62.5 in | Wt 241.0 lb

## 2022-09-18 DIAGNOSIS — Z1231 Encounter for screening mammogram for malignant neoplasm of breast: Secondary | ICD-10-CM

## 2022-09-18 DIAGNOSIS — M25311 Other instability, right shoulder: Secondary | ICD-10-CM | POA: Diagnosis not present

## 2022-09-18 DIAGNOSIS — M25511 Pain in right shoulder: Secondary | ICD-10-CM | POA: Diagnosis not present

## 2022-09-18 DIAGNOSIS — B009 Herpesviral infection, unspecified: Secondary | ICD-10-CM

## 2022-09-18 DIAGNOSIS — G8929 Other chronic pain: Secondary | ICD-10-CM | POA: Insufficient documentation

## 2022-09-18 LAB — LIPID PANEL
Cholesterol: 177 mg/dL (ref 0–200)
HDL: 40.9 mg/dL (ref >39.00)
LDL Cholesterol: 125 mg/dL — ABNORMAL HIGH (ref 0–99)
NonHDL: 136.38
Total CHOL/HDL Ratio: 4
Triglycerides: 58 mg/dL (ref 0.0–149.0)
VLDL: 11.6 mg/dL (ref 0.0–40.0)

## 2022-09-18 LAB — COMPREHENSIVE METABOLIC PANEL
ALT: 13 U/L (ref 0–35)
AST: 13 U/L (ref 0–37)
Albumin: 3.8 g/dL (ref 3.5–5.2)
Alkaline Phosphatase: 65 U/L (ref 39–117)
BUN: 10 mg/dL (ref 6–23)
CO2: 28 mEq/L (ref 19–32)
Calcium: 8.9 mg/dL (ref 8.4–10.5)
Chloride: 106 mEq/L (ref 96–112)
Creatinine, Ser: 1.15 mg/dL (ref 0.40–1.20)
GFR: 59.89 mL/min — ABNORMAL LOW (ref >60.00)
Glucose, Bld: 95 mg/dL (ref 70–99)
Potassium: 4.1 mEq/L (ref 3.5–5.1)
Sodium: 139 mEq/L (ref 135–145)
Total Bilirubin: 0.4 mg/dL (ref 0.2–1.2)
Total Protein: 7 g/dL (ref 6.0–8.3)

## 2022-09-18 LAB — TSH: TSH: 1.26 u[IU]/mL (ref 0.35–5.50)

## 2022-09-18 LAB — HEMOGLOBIN A1C: Hgb A1c MFr Bld: 5 % (ref 4.6–6.5)

## 2022-09-18 MED ORDER — VALACYCLOVIR HCL 500 MG PO TABS
ORAL_TABLET | ORAL | 0 refills | Status: DC
Start: 2022-09-18 — End: 2023-09-02

## 2022-09-18 MED ORDER — CYCLOBENZAPRINE HCL 5 MG PO TABS
5.0000 mg | ORAL_TABLET | Freq: Two times a day (BID) | ORAL | 0 refills | Status: AC | PRN
Start: 2022-09-18 — End: ?

## 2022-09-18 MED ORDER — WEGOVY 0.25 MG/0.5ML ~~LOC~~ SOAJ: 0.2500 mg | SUBCUTANEOUS | 0 refills | Status: DC

## 2022-09-18 MED ORDER — IBUPROFEN-FAMOTIDINE 800-26.6 MG PO TABS
1.0000 | ORAL_TABLET | Freq: Two times a day (BID) | ORAL | 3 refills | Status: AC | PRN
Start: 2022-09-18 — End: ?

## 2022-09-18 NOTE — Patient Instructions (Addendum)
Total Calories per day: 1600  Total protein per day: 120 grams   Total carbs: 90 grams per day  Lose It!

## 2022-09-18 NOTE — Assessment & Plan Note (Signed)
Patient saw a different orthopedist who recommended NSAIDS, PT and an injection. She has been participating in PT for about 4 months without improvement and the injection did not help with her pain. Will send to a different orthopedist and order an MRI of the right shoulder since she is reporting instability and she has failed physical therapy and NSAIDS.

## 2022-09-18 NOTE — Progress Notes (Signed)
New Patient Office Visit  Subjective    Patient ID: Gabrielle Caldwell, female    DOB: 1980/04/01  Age: 42 y.o. MRN: [id number]  CC: No chief complaint on file.   HPI Gabrielle Caldwell presents to establish care Pt reports that she is usually just going to Dr. Cherly Hensen her GYN for her regular care. 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 States that there is pain intermittently with fastening her bra (reaching behind her) and raising her arm. States that she has been seen by an orthopedist in the past, but was told that it was just "tendonitis" however she continues to have issues. States they gave her a cortisone shot and ibuprofen, however it lonely lasted about 3 weeks. Has already been through a course of physical therapy (has a trainer who is also PT certified) for about 4 months which also was ineffective.   States that when she rolls over in bed onto that shoulder gets pain. Occasionally feels like it is going to pop out of place.  Patient would also like to discuss weight loss. She exercises daily and has a Systems analyst, states she has lost 20 pounds on her own. States that she used to be in a weight loss program that was based on calorie restriction, was in that program for 8 months and didn't lose any weight. States she also is doing low carb diet currently and has been moderately successful. States that she is still doing low carb. States that she does sometimes have difficulty controlling her appetite and will often have emotional eating.   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   Seasonal allergies     Past Surgical History:  Procedure Laterality Date   BREAST LUMPECTOMY WITH RADIOACTIVE SEED LOCALIZATION Right 10/07/2017   Procedure: RIGHT BREAST LUMPECTOMY WITH RADIOACTIVE SEED LOCALIZATION X'S 2;  Surgeon: Harriette Bouillon, MD;  Location: [facility];  Service: General;  Laterality: Right;   GYNECOLOGIC CRYOSURGERY     FOR DYSPLASIA   INTRAUTERINE DEVICE INSERTION  01/03/2019   Mirena   RE-EXCISION OF BREAST LUMPECTOMY Right 12/21/2017   Procedure: RE-EXCISION OF RIGHT BREAST LUMPECTOMY;  Surgeon: Harriette Bouillon, MD;  Location: [facility];  Service: General;  Laterality: Right;   RETINAL DETACHMENT SURGERY Right 10/2012    Family History  Adopted: Yes  Problem Relation Age of Onset   Cancer Maternal Grandmother        cervical    Social History   Socioeconomic History   Marital status: Single    Spouse name: Not on file   Number of children: Not on file   Years of education: Not on file   Highest education level: Not on file  Occupational History   Not on file  Tobacco Use   Smoking status: Former    Current packs/day: 0.00  Types: Cigarettes    Quit date: 01/26/2003    Years since quitting: 19.6   Smokeless tobacco: Never  Vaping Use   Vaping status: Never Used  Substance and Sexual Activity   Alcohol use: Yes    Comment: 3 drinks/week   Drug use: No   Sexual activity: Yes    Birth control/protection: I.U.D., None  Other Topics Concern   Not on file  Social History Narrative   Not on file   Social Determinants of Health   Financial Resource Strain: Not on file  Food Insecurity: Not on file  Transportation Needs: Not on file  Physical Activity: Not on file  Stress: Not on file  Social Connections: Not on file   Intimate Partner Violence: Not on file    Review of Systems  All other systems reviewed and are negative.       Objective    BP 120/78 (BP Location: Left Arm, Patient Position: Sitting, Cuff Size: Large)   Pulse 75   Temp 98.8 F (37.1 C) (Oral)   Ht 5' 2.5" (1.588 m)   Wt 241 lb (109.3 kg)   SpO2 99%   BMI 43.38 kg/m   Physical Exam Vitals reviewed.  Constitutional:      Appearance: Normal appearance. She is well-groomed. She is morbidly obese.  Neck:     Thyroid: No thyromegaly.  Cardiovascular:     Rate and Rhythm: Normal rate and regular rhythm.     Pulses: Normal pulses.     Heart sounds: S1 normal and S2 normal.  Pulmonary:     Effort: Pulmonary effort is normal.     Breath sounds: Normal breath sounds and air entry.  Musculoskeletal:     Right lower leg: No edema.     Left lower leg: No edema.  Neurological:     Mental Status: She is alert and oriented to person, place, and time. Mental status is at baseline.     Gait: Gait is intact.  Psychiatric:        Mood and Affect: Mood and affect normal.        Speech: Speech normal.        Behavior: Behavior normal.        Judgment: Judgment normal.         Assessment & Plan:  Chronic right shoulder pain Assessment & Plan: Patient saw a different orthopedist who recommended NSAIDS, PT and an injection. She has been participating in PT for about 4 months without improvement and the injection did not help with her pain. Will send to a different orthopedist and order an MRI of the right shoulder since she is reporting instability and she has failed physical therapy and NSAIDS.   Orders: -     Ambulatory referral to Orthopedic Surgery -     MR SHOULDER RIGHT WO CONTRAST; Future -     Cyclobenzaprine HCl; Take 1 tablet (5 mg total) by mouth 2 (two) times daily as needed for muscle spasms.  Dispense: 20 tablet; Refill: 0 -     Ibuprofen-Famotidine; Take 1 tablet by mouth 2 (two) times daily as needed.   Dispense: 60 tablet; Refill: 3  Shoulder instability, right -     MR SHOULDER RIGHT WO CONTRAST; Future  Breast cancer screening by mammogram -     3D Screening Mammogram, Left and Right; Future  Morbid obesity (HCC) Assessment & Plan: I have had an extensive 30 minute conversation today with the patient about healthy eating habits, exercise,  calorie and carb goals for sustainable and successful weight loss. I gave the patient caloric and protein daily intake values as well as described the importance of increasing fiber and water intake. I discussed weight loss medications that could be used in the treatment of this patient. Handouts on low carb eating were given to the patient.    Since patient has failed diet and exercise and has chronic hip pain as a co morbid condition, we discussed medications and we will try to order [facility] starting at 0.25 mg weekly risks/benefits discussed with patient. If this is not approved then will call in phentermine at 15 mg capsules daily.   Her dietary plan is as follows:   Total Calories per day: 1600  Total protein per day: 120 grams   Total carbs: 90 grams per day   Orders: -     Comprehensive metabolic panel -     Lipid panel -     TSH -     Hemoglobin A1c -     Wegovy; Inject 0.25 mg into the skin once a week.  Dispense: 2 mL; Refill: 0  HSV infection -     valACYclovir HCl; TAKE TWICE DAILY FOR 3-5 DAYS AS NEEDED  Dispense: 90 tablet; Refill: 0  I spent a total of 45 minutes today with patient reviewing her history, previous notes in the chart, counseling on dietary changes, etc.   Return in about 3 months (around 12/19/2022) for weight loss.   Karie Georges, MD

## 2022-09-18 NOTE — Assessment & Plan Note (Signed)
I have had an extensive 30 minute conversation today with the patient about healthy eating habits, exercise, calorie and carb goals for sustainable and successful weight loss. I gave the patient caloric and protein daily intake values as well as described the importance of increasing fiber and water intake. I discussed weight loss medications that could be used in the treatment of this patient. Handouts on low carb eating were given to the patient.    Since patient has failed diet and exercise and has chronic hip pain as a co morbid condition, we discussed medications and we will try to order [facility] starting at 0.25 mg weekly risks/benefits discussed with patient. If this is not approved then will call in phentermine at 15 mg capsules daily.   Her dietary plan is as follows:   Total Calories per day: 1600  Total protein per day: 120 grams   Total carbs: 90 grams per day

## 2022-09-23 ENCOUNTER — Encounter: Payer: Self-pay | Admitting: Family Medicine

## 2022-09-24 ENCOUNTER — Telehealth: Payer: Self-pay

## 2022-09-24 NOTE — Telephone Encounter (Signed)
*  Primary  Pharmacy Patient Advocate Encounter   Received notification from CoverMyMeds that prior authorization for Ibuprofen-Famotidine 800-26.6MG  tablets  is required/requested.   Insurance verification completed.   The patient is insured through [employer] .   Per test claim: PA required; PA submitted to [employer] via CoverMyMeds Key/confirmation #/EOC [id number] Status is pending

## 2022-09-25 NOTE — Telephone Encounter (Signed)
Pharmacy Patient Advocate Encounter  Received notification from [employer] that Prior Authorization for Ibuprofen-Famotidine 800-26.6MG  tablets  has been DENIED.  Full denial letter will be uploaded to the media tab. See denial reason below.   PA #/Case ID/Reference #: Coverage is provided in situations where the patient has tried one prescription histamine-2 receptor antagonist (H2RA, for example: famotidine, ranitidine, or nizatidine) AND one prescription ibuprofen product. Coverage cannot be authorized at this time

## 2022-09-30 MED ORDER — PHENTERMINE HCL 15 MG PO CAPS: 15.0000 mg | ORAL_CAPSULE | ORAL | 0 refills | Status: DC

## 2022-09-30 NOTE — Telephone Encounter (Signed)
Spoke with the patient and informed her of the message below.  Patient stated she already spoke with someone at [facility] regarding the Rx.

## 2022-09-30 NOTE — Telephone Encounter (Signed)
Please let pt know-- she may take them separately and they would be less expensive.

## 2022-10-01 ENCOUNTER — Telehealth: Payer: Self-pay | Admitting: *Deleted

## 2022-10-01 NOTE — Telephone Encounter (Signed)
[employer] sent a fax stating more information with the medical records are needed to review for  the MRIs of the left and right wrists (CPT code 40981).  Message sent to referral coordinator for assistance.

## 2022-10-16 ENCOUNTER — Telehealth: Payer: Self-pay | Admitting: Family Medicine

## 2022-10-16 NOTE — Telephone Encounter (Signed)
Pt called to say they are requesting:  Detailed OV notes Medical records Supporting documents Symptoms Labs List of Medications  To schedule Peer to Peer: [phone] - Opt 3

## 2022-10-16 NOTE — Telephone Encounter (Signed)
Pt was sch for MRI of shoulder at [facility] on 10-18-2022. Barnie Del will call the pt to cancel the appt in the meantime aleshia would like to know if md will do P2P . The Mri of shoulder was denied

## 2022-10-18 ENCOUNTER — Other Ambulatory Visit: Payer: 59

## 2022-10-27 NOTE — Telephone Encounter (Signed)
I sent her to a different orthopedist so they can decide if she needs the MRI and they can order it-- it is more likely to be approved if the orthopedist orders the test.

## 2022-10-28 NOTE — Telephone Encounter (Signed)
Spoke with Ebony at [facility] and informed her of the message below.  Karel Jarvis stated she will note this on the file.

## 2022-11-02 LAB — HM PAP SMEAR
Chlamydia, Swab/Urine, PCR: NEGATIVE
HPV, high-risk: NEGATIVE

## 2022-11-08 ENCOUNTER — Other Ambulatory Visit: Payer: Self-pay | Admitting: Family Medicine

## 2022-11-15 ENCOUNTER — Other Ambulatory Visit: Payer: Self-pay | Admitting: Family Medicine

## 2022-11-16 MED ORDER — PHENTERMINE HCL 15 MG PO CAPS
15.0000 mg | ORAL_CAPSULE | ORAL | 0 refills | Status: DC
Start: 2022-11-16 — End: 2023-01-01

## 2022-12-07 ENCOUNTER — Ambulatory Visit
Admission: RE | Admit: 2022-12-07 | Discharge: 2022-12-07 | Disposition: A | Discharge status: Home / Self Care | Payer: 59 | Source: Ambulatory Visit | Attending: Family Medicine | Admitting: Family Medicine

## 2022-12-07 DIAGNOSIS — Z1231 Encounter for screening mammogram for malignant neoplasm of breast: Secondary | ICD-10-CM

## 2022-12-09 ENCOUNTER — Other Ambulatory Visit: Payer: Self-pay | Admitting: Family Medicine

## 2022-12-09 DIAGNOSIS — R928 Other abnormal and inconclusive findings on diagnostic imaging of breast: Secondary | ICD-10-CM

## 2023-01-01 ENCOUNTER — Ambulatory Visit (INDEPENDENT_AMBULATORY_CARE_PROVIDER_SITE_OTHER): Payer: 59 | Admitting: Family Medicine

## 2023-01-01 ENCOUNTER — Encounter: Payer: Self-pay | Admitting: Family Medicine

## 2023-01-01 DIAGNOSIS — Z6841 Body Mass Index (BMI) 40.0 and over, adult: Secondary | ICD-10-CM | POA: Diagnosis not present

## 2023-01-01 MED ORDER — PHENTERMINE HCL 15 MG PO CAPS: 15.0000 mg | ORAL_CAPSULE | ORAL | 0 refills | Status: AC

## 2023-01-01 NOTE — Progress Notes (Signed)
   Established Patient Office Visit  Subjective   Patient ID: Gabrielle Caldwell, female    DOB: 05-22-1980  Age: 42 y.o. MRN: [id number]  No chief complaint on file.   Patient is here for follow up today. States she did go to the orthopedist and received an injection for her shoulder which helped/ pain resolved now.   Obesity-- patient reports the medication did help reduce her appetite. Pt reports that she has been out of the medication for a couple of months-- would like to continue the medication. She denies any side effects to the medication. Reports she is still exercising and eating a low carb diet.     Current Outpatient Medications  Medication Instructions   acetaminophen (TYLENOL) 650 mg, Oral, Every 6 hours PRN   cyclobenzaprine (FLEXERIL) 5 mg, Oral, 2 times daily PRN   ibuprofen (ADVIL) 800 mg, Oral, Every 8 hours PRN   Ibuprofen-Famotidine (DUEXIS) 800-26.6 MG TABS 1 tablet, Oral, 2 times daily PRN   [START ON 02/26/2023] phentermine 15 mg, Oral, BH-each morning   [START ON 01/29/2023] phentermine 15 mg, Oral, BH-each morning   phentermine 15 mg, Oral, BH-each morning   valACYclovir (VALTREX) 500 MG tablet TAKE TWICE DAILY FOR 3-5 DAYS AS NEEDED    Patient Active Problem List   Diagnosis Date Noted   Chronic right shoulder pain 09/18/2022   Morbid obesity (HCC) 09/18/2022   HSV infection 09/18/2022   IUD (intrauterine device) in place 12/27/2013      Review of Systems  All other systems reviewed and are negative.     Objective:     BP 134/74 (BP Location: Left Arm, Patient Position: Sitting, Cuff Size: Large)   Pulse 97   Temp 97.6 F (36.4 C) (Oral)   Ht 5' 2.5" (1.588 m)   Wt 240 lb 12.8 oz (109.2 kg)   SpO2 100%   BMI 43.34 kg/m    Physical Exam Vitals reviewed.  Constitutional:      Appearance: Normal appearance. She is morbidly obese.  Cardiovascular:     Rate and Rhythm: Normal rate and regular rhythm.     Heart sounds: Normal heart sounds. No  murmur heard. Pulmonary:     Effort: Pulmonary effort is normal.  Musculoskeletal:     Right lower leg: No edema.     Left lower leg: No edema.  Neurological:     Mental Status: She is alert and oriented to person, place, and time. Mental status is at baseline.      No results found for any visits on 01/01/23.    The 10-year ASCVD risk score (Arnett DK, et al., 2019) is: 1.3%    Assessment & Plan:  Morbid obesity (HCC) Assessment & Plan: Pt has been out of her medication, will refill it for the next 3 months and see her back for weight check in 3 months.   Orders: -     Phentermine HCl; Take 1 capsule (15 mg total) by mouth every morning.  Dispense: 30 capsule; Refill: 0 -     Phentermine HCl; Take 1 capsule (15 mg total) by mouth every morning.  Dispense: 30 capsule; Refill: 0 -     Phentermine HCl; Take 1 capsule (15 mg total) by mouth every morning.  Dispense: 30 capsule; Refill: 0     Return in about 3 months (around 04/01/2023) for weight loss.    Karie Georges, MD

## 2023-01-01 NOTE — Patient Instructions (Signed)
[email or web address]  [email or web address]  [facility]

## 2023-01-08 NOTE — Assessment & Plan Note (Signed)
Pt has been out of her medication, will refill it for the next 3 months and see her back for weight check in 3 months.

## 2023-02-10 ENCOUNTER — Other Ambulatory Visit: Payer: Self-pay | Admitting: Family Medicine

## 2023-02-10 ENCOUNTER — Ambulatory Visit
Admission: RE | Admit: 2023-02-10 | Discharge: 2023-02-10 | Disposition: A | Discharge status: Home / Self Care | Payer: 59 | Source: Ambulatory Visit | Attending: Family Medicine | Admitting: Family Medicine

## 2023-02-10 DIAGNOSIS — R928 Other abnormal and inconclusive findings on diagnostic imaging of breast: Secondary | ICD-10-CM

## 2023-02-10 DIAGNOSIS — R921 Mammographic calcification found on diagnostic imaging of breast: Secondary | ICD-10-CM

## 2023-04-02 ENCOUNTER — Ambulatory Visit: Payer: 59 | Admitting: Family Medicine

## 2023-08-20 ENCOUNTER — Ambulatory Visit
Admission: RE | Admit: 2023-08-20 | Discharge: 2023-08-20 | Disposition: A | Discharge status: Home / Self Care | Source: Ambulatory Visit | Attending: Family Medicine | Admitting: Family Medicine

## 2023-08-20 ENCOUNTER — Other Ambulatory Visit: Payer: Self-pay | Admitting: Family Medicine

## 2023-08-20 DIAGNOSIS — R921 Mammographic calcification found on diagnostic imaging of breast: Secondary | ICD-10-CM

## 2023-08-23 ENCOUNTER — Other Ambulatory Visit: Payer: Self-pay | Admitting: Family Medicine

## 2023-08-23 DIAGNOSIS — R921 Mammographic calcification found on diagnostic imaging of breast: Secondary | ICD-10-CM

## 2023-08-24 ENCOUNTER — Telehealth: Payer: Self-pay | Admitting: *Deleted

## 2023-08-24 NOTE — Telephone Encounter (Signed)
 Left a detailed message at the number below for Lauraine requesting to fax the order to our office at [phone] for PCP to review and once completed this would be faxed.

## 2023-08-24 NOTE — Telephone Encounter (Signed)
 Copied from CRM [id number]. Topic: Clinical - Request for Lab/Test Order >> Aug 24, 2023 11:42 AM Burnard DEL wrote: Reason for CRM: [facility] [place] called in stating that patient will be having a biopsy done on tomorrow 08/25/2023. They are needing a signed order for the biopsy. Biopsy was recommended after her mammogram  Contact#: [phone] Fax#:[phone]

## 2023-08-26 ENCOUNTER — Ambulatory Visit
Admission: RE | Admit: 2023-08-26 | Discharge: 2023-08-26 | Disposition: A | Discharge status: Home / Self Care | Source: Ambulatory Visit | Attending: Family Medicine | Admitting: Family Medicine

## 2023-08-26 DIAGNOSIS — R921 Mammographic calcification found on diagnostic imaging of breast: Secondary | ICD-10-CM

## 2023-08-26 HISTORY — PX: BREAST BIOPSY: SHX20

## 2023-08-27 LAB — SURGICAL PATHOLOGY

## 2023-09-01 ENCOUNTER — Ambulatory Visit: Payer: Self-pay | Admitting: Family Medicine

## 2023-09-02 ENCOUNTER — Other Ambulatory Visit: Payer: Self-pay | Admitting: Family Medicine

## 2023-09-02 DIAGNOSIS — B009 Herpesviral infection, unspecified: Secondary | ICD-10-CM

## 2023-11-28 ENCOUNTER — Ambulatory Visit
Admission: RE | Admit: 2023-11-28 | Discharge: 2023-11-28 | Disposition: A | Discharge status: Home / Self Care | Source: Ambulatory Visit

## 2023-11-28 ENCOUNTER — Ambulatory Visit

## 2023-11-28 ENCOUNTER — Other Ambulatory Visit: Payer: Self-pay

## 2023-11-28 VITALS — BP 128/85 | HR 69 | Temp 98.5°F | Resp 18

## 2023-11-28 DIAGNOSIS — J209 Acute bronchitis, unspecified: Secondary | ICD-10-CM

## 2023-11-28 DIAGNOSIS — R059 Cough, unspecified: Secondary | ICD-10-CM

## 2023-11-28 MED ORDER — ALBUTEROL SULFATE HFA 108 (90 BASE) MCG/ACT IN AERS: 2.0000 | INHALATION_SPRAY | RESPIRATORY_TRACT | 0 refills | Status: AC | PRN

## 2023-11-28 MED ORDER — GUAIFENESIN ER 600 MG PO TB12: 600.0000 mg | ORAL_TABLET | Freq: Two times a day (BID) | ORAL | 0 refills | Status: AC

## 2023-11-28 MED ORDER — PREDNISONE 50 MG PO TABS
ORAL_TABLET | ORAL | 0 refills | Status: AC
Start: 2023-11-28 — End: ?

## 2023-11-28 MED ORDER — BENZONATATE 100 MG PO CAPS: 100.0000 mg | ORAL_CAPSULE | Freq: Three times a day (TID) | ORAL | 0 refills | Status: AC

## 2023-11-28 NOTE — ED Provider Notes (Signed)
 [facility]    CSN: [id number] Arrival date & time: 11/28/23  1027      History   Chief Complaint Chief Complaint  Patient presents with   Cough    HPI Gabrielle Caldwell is a 43 y.o. female.   Patient presents today due to cough productive of thick, yellow-green sputum since Monday.  Patient states that she had a fever 101 on Monday as well.  Yesterday her temperature was 99.  Patient states that she is experiencing shortness of breath with exertion and has a reduced appetite.  Patient states that she feels similar to how she did when she had pneumonia.  Patient states that she has been taking over-the-counter cold medications with temporary relief.   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   Seasonal allergies     Patient Active Problem List   Diagnosis Date Noted   Chronic right shoulder pain 09/18/2022   Morbid obesity (HCC) 09/18/2022   HSV infection 09/18/2022   IUD (intrauterine device) in place 12/27/2013    Past Surgical History:  Procedure Laterality Date   BREAST BIOPSY Left 08/26/2023   MM LT BREAST BX W LOC DEV 1ST LESION IMAGE BX SPEC STEREO GUIDE 08/26/2023 [facility]   BREAST LUMPECTOMY WITH RADIOACTIVE SEED LOCALIZATION Right 10/07/2017   Procedure: RIGHT BREAST LUMPECTOMY WITH RADIOACTIVE SEED LOCALIZATION X'S 2;  Surgeon: Vanderbilt Ned, MD;  Location: [facility];  Service: General;  Laterality: Right;   GYNECOLOGIC CRYOSURGERY     FOR DYSPLASIA   INTRAUTERINE DEVICE INSERTION  01/03/2019   Mirena    RE-EXCISION OF BREAST LUMPECTOMY Right 12/21/2017   Procedure: RE-EXCISION OF RIGHT BREAST LUMPECTOMY;  Surgeon: Vanderbilt Ned, MD;  Location: [facility];  Service: General;  Laterality: Right;   RETINAL DETACHMENT SURGERY Right 10/2012    OB History     Gravida  2   Para  1   Term  1   Preterm  0   AB   1   Living  1      SAB  0   IAB  1   Ectopic  0   Multiple  0   Live Births  1            Home Medications    Prior to Admission medications   Medication Sig Start Date End Date Taking? Authorizing Provider  acetaminophen  (TYLENOL ) 325 MG tablet Take 650 mg by mouth every 6 (six) hours as needed.   Yes [provider]  albuterol (VENTOLIN HFA) 108 (90 Base) MCG/ACT inhaler Inhale 2 puffs into the lungs every 4 (four) hours as needed for wheezing or shortness of breath. 11/28/23  Yes Andra Krabbe C, PA-C  benzonatate (TESSALON) 100 MG capsule Take 1 capsule (100 mg total) by mouth every 8 (eight) hours. 11/28/23  Yes Andra Krabbe BROCKS, PA-C  guaiFENesin (MUCINEX) 600 MG 12 hr tablet Take 1 tablet (600 mg total) by mouth 2 (two) times daily for 10 days. 11/28/23 12/08/23 Yes Andra Krabbe C, PA-C  ibuprofen  (ADVIL ,MOTRIN ) 800 MG tablet Take 1 tablet (800 mg total) by mouth every 8 (eight) hours as needed. 10/07/17  Yes Cornett, Ned, MD  phentermine  15 MG capsule Take 1 capsule (15 mg total) by mouth every morning. 02/26/23  Yes Ozell Heron HERO, MD  predniSONE (DELTASONE) 50 MG tablet Take  1 tab po daily for 5 days 11/28/23  Yes Andra Krabbe C, PA-C  valACYclovir  (VALTREX ) 500 MG tablet TAKE 1 TABLET BY MOUTH TWICE DAILY FOR 3 TO 5 DAYS AS NEEDED 09/02/23  Yes Ozell Heron HERO, MD  cyclobenzaprine  (FLEXERIL ) 5 MG tablet Take 1 tablet (5 mg total) by mouth 2 (two) times daily as needed for muscle spasms. 09/18/22   Ozell Heron HERO, MD  famotidine  (PEPCID ) 20 MG tablet Take 1 tablet by mouth 2 (two) times daily as needed.    [provider]  Ibuprofen -Famotidine  (DUEXIS ) 800-26.6 MG TABS Take 1 tablet by mouth 2 (two) times daily as needed. Patient not taking: Reported on 11/28/2023 09/18/22   Ozell Heron HERO, MD  phentermine  15 MG capsule Take 1 capsule (15 mg total) by mouth every morning. 01/29/23   Ozell Heron HERO, MD  phentermine   15 MG capsule Take 1 capsule (15 mg total) by mouth every morning. 01/01/23   Ozell Heron HERO, MD    Family History Family History  Adopted: Yes  Problem Relation Age of Onset   Cancer Maternal Grandmother        cervical    Social History Social History   Tobacco Use   Smoking status: Some Days    Current packs/day: 0.00    Types: Cigarettes    Last attempt to quit: 01/26/2003    Years since quitting: 20.8   Smokeless tobacco: Never  Vaping Use   Vaping status: Never Used  Substance Use Topics   Alcohol use: Yes    Comment: 3 drinks/week   Drug use: No     Allergies   Bee venom and Strawberry [berry]   Review of Systems Review of Systems  Respiratory:  Positive for cough.      Physical Exam Triage Vital Signs ED Triage Vitals  Encounter Vitals Group     BP 11/28/23 1108 128/85     Girls Systolic BP Percentile --      Girls Diastolic BP Percentile --      Boys Systolic BP Percentile --      Boys Diastolic BP Percentile --      Pulse Rate 11/28/23 1108 69     Resp 11/28/23 1108 18     Temp 11/28/23 1108 98.5 F (36.9 C)     Temp Source 11/28/23 1108 Oral     SpO2 11/28/23 1108 97 %     Weight --      Height --      Head Circumference --      Peak Flow --      Pain Score 11/28/23 1104 0     Pain Loc --      Pain Education --      Exclude from Growth Chart --    No data found.  Updated Vital Signs BP 128/85 (BP Location: Right Arm)   Pulse 69   Temp 98.5 F (36.9 C) (Oral)   Resp 18   LMP  (LMP Unknown)   SpO2 97%   Visual Acuity Right Eye Distance:   Left Eye Distance:   Bilateral Distance:    Right Eye Near:   Left Eye Near:    Bilateral Near:     Physical Exam Vitals and nursing note reviewed.  Constitutional:      General: She is not in acute distress.    Appearance: Normal appearance. She is not ill-appearing, toxic-appearing or diaphoretic.  Eyes:     General: No scleral icterus. Cardiovascular:  Rate and Rhythm:  Normal rate and regular rhythm.     Heart sounds: Normal heart sounds.  Pulmonary:     Effort: Pulmonary effort is normal. No respiratory distress.     Breath sounds: Normal breath sounds. No wheezing or rhonchi.  Skin:    General: Skin is warm.  Neurological:     Mental Status: She is alert and oriented to person, place, and time.  Psychiatric:        Mood and Affect: Mood normal.        Behavior: Behavior normal.      UC Treatments / Results  Labs (all labs ordered are listed, but only abnormal results are displayed) Labs Reviewed - No data to display  EKG   Radiology DG Chest 2 View Result Date: 11/28/2023 EXAM: 2 VIEW(S) XRAY OF THE CHEST 11/28/2023 11:43:40 AM COMPARISON: None available. CLINICAL HISTORY: Cough. FINDINGS: LUNGS AND PLEURA: No focal pulmonary opacity. No pulmonary edema. No pleural effusion. No pneumothorax. HEART AND MEDIASTINUM: No acute abnormality of the cardiac and mediastinal silhouettes. BONES AND SOFT TISSUES: No acute osseous abnormality. IMPRESSION: 1. No acute process. Electronically signed by: Lynwood Seip MD 11/28/2023 11:48 AM EST RP Workstation: [id number]    Procedures Procedures (including critical care time)  Medications Ordered in UC Medications - No data to display  Initial Impression / Assessment and Plan / UC Course  I have reviewed the triage vital signs and the nursing notes.  Pertinent labs & imaging results that were available during my care of the patient were reviewed by me and considered in my medical decision making (see chart for details).     Chest x-ray shows no signs of pneumonia, will treat as acute bronchitis. Final Clinical Impressions(s) / UC Diagnoses   Final diagnoses:  Cough, unspecified type  Acute bronchitis, unspecified organism     Discharge Instructions      Chest x-ray shows no signs of pneumonia.  I believe you have acute bronchitis.  I will treat you with Mucinex, a cough suppressant, albuterol,  and a short course of steroids.  If for some reason your symptoms do not improve you develop a fever of 100.5 or above, increased shortness of breath, nausea or vomiting that is reasons to follow-up or report to the ER.     ED Prescriptions     Medication Sig Dispense Auth. Provider   benzonatate (TESSALON) 100 MG capsule Take 1 capsule (100 mg total) by mouth every 8 (eight) hours. 30 capsule Andra Krabbe C, PA-C   guaiFENesin (MUCINEX) 600 MG 12 hr tablet Take 1 tablet (600 mg total) by mouth 2 (two) times daily for 10 days. 20 tablet Andra Krabbe C, PA-C   albuterol (VENTOLIN HFA) 108 (90 Base) MCG/ACT inhaler Inhale 2 puffs into the lungs every 4 (four) hours as needed for wheezing or shortness of breath. 8 g Andra Krabbe C, PA-C   predniSONE (DELTASONE) 50 MG tablet Take 1 tab po daily for 5 days 5 tablet Andra Krabbe BROCKS, PA-C      PDMP not reviewed this encounter.   Andra Krabbe BROCKS, PA-C 11/28/23 1154

## 2023-11-28 NOTE — Discharge Instructions (Addendum)
 Chest x-ray shows no signs of pneumonia.  I believe you have acute bronchitis.  I will treat you with Mucinex, a cough suppressant, albuterol, and a short course of steroids.  If for some reason your symptoms do not improve you develop a fever of 100.5 or above, increased shortness of breath, nausea or vomiting that is reasons to follow-up or report to the ER.

## 2023-11-28 NOTE — ED Triage Notes (Addendum)
 Pt reports sore throat, cough, wheezy, fatigue, fever 101, chills since Monday. Taken mucinex, tylenol  cold day/night, hot tea. States coughing up thick tan mucous. No meds taken today.
